# Patient Record
Sex: Female | Born: 1985 | Race: White | Hispanic: No | Marital: Single | Smoking: Never smoker
Health system: Southern US, Community
[De-identification: ages and names within clinical notes are randomized; demographics above are authoritative.]

## PROBLEM LIST (undated history)

## (undated) DIAGNOSIS — G5750 Tarsal tunnel syndrome, unspecified lower limb: Secondary | ICD-10-CM

## (undated) HISTORY — PX: TUBAL LIGATION: SHX77

---
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
## 2005-02-23 ENCOUNTER — Other Ambulatory Visit: Admission: RE | Admit: 2005-02-23 | Discharge: 2005-02-23 | Payer: Self-pay | Admitting: Gynecology

## 2005-03-09 ENCOUNTER — Encounter: Admission: RE | Admit: 2005-03-09 | Discharge: 2005-06-07 | Payer: Self-pay | Admitting: Gynecology

## 2005-09-10 ENCOUNTER — Inpatient Hospital Stay (HOSPITAL_COMMUNITY): Admission: AD | Admit: 2005-09-10 | Discharge: 2005-09-10 | Payer: Self-pay | Admitting: Gynecology

## 2005-11-04 ENCOUNTER — Other Ambulatory Visit: Admission: RE | Admit: 2005-11-04 | Discharge: 2005-11-04 | Payer: Self-pay | Admitting: Gynecology

## 2006-05-01 ENCOUNTER — Ambulatory Visit (HOSPITAL_COMMUNITY): Admission: RE | Admit: 2006-05-01 | Discharge: 2006-05-01 | Payer: Self-pay | Admitting: Gynecology

## 2007-05-20 ENCOUNTER — Inpatient Hospital Stay (HOSPITAL_COMMUNITY): Admission: AD | Admit: 2007-05-20 | Discharge: 2007-05-20 | Payer: Self-pay | Admitting: Obstetrics and Gynecology

## 2007-11-22 ENCOUNTER — Ambulatory Visit (HOSPITAL_COMMUNITY): Admission: RE | Admit: 2007-11-22 | Discharge: 2007-11-22 | Payer: Self-pay | Admitting: Obstetrics and Gynecology

## 2007-11-22 ENCOUNTER — Encounter (INDEPENDENT_AMBULATORY_CARE_PROVIDER_SITE_OTHER): Payer: Self-pay | Admitting: Obstetrics and Gynecology

## 2008-08-01 ENCOUNTER — Encounter (INDEPENDENT_AMBULATORY_CARE_PROVIDER_SITE_OTHER): Payer: Self-pay | Admitting: Obstetrics and Gynecology

## 2008-08-01 ENCOUNTER — Ambulatory Visit: Payer: Self-pay | Admitting: Surgery

## 2008-08-01 ENCOUNTER — Ambulatory Visit: Admission: RE | Admit: 2008-08-01 | Discharge: 2008-08-01 | Payer: Self-pay | Admitting: Obstetrics and Gynecology

## 2008-09-06 ENCOUNTER — Inpatient Hospital Stay (HOSPITAL_COMMUNITY): Admission: AD | Admit: 2008-09-06 | Discharge: 2008-09-06 | Payer: Self-pay | Admitting: Obstetrics and Gynecology

## 2008-10-26 ENCOUNTER — Inpatient Hospital Stay (HOSPITAL_COMMUNITY): Admission: AD | Admit: 2008-10-26 | Discharge: 2008-10-26 | Payer: Self-pay | Admitting: Obstetrics and Gynecology

## 2008-12-08 ENCOUNTER — Inpatient Hospital Stay (HOSPITAL_COMMUNITY): Admission: AD | Admit: 2008-12-08 | Discharge: 2008-12-10 | Payer: Self-pay | Admitting: Obstetrics and Gynecology

## 2009-08-21 ENCOUNTER — Inpatient Hospital Stay (HOSPITAL_COMMUNITY): Admission: AD | Admit: 2009-08-21 | Discharge: 2009-08-21 | Payer: Self-pay | Admitting: Obstetrics & Gynecology

## 2009-09-03 ENCOUNTER — Inpatient Hospital Stay (HOSPITAL_COMMUNITY): Admission: AD | Admit: 2009-09-03 | Discharge: 2009-09-03 | Payer: Self-pay | Admitting: Obstetrics & Gynecology

## 2009-09-05 ENCOUNTER — Ambulatory Visit (HOSPITAL_COMMUNITY): Admission: AD | Admit: 2009-09-05 | Discharge: 2009-09-05 | Payer: Self-pay | Admitting: Obstetrics & Gynecology

## 2009-09-07 ENCOUNTER — Inpatient Hospital Stay (HOSPITAL_COMMUNITY): Admission: AD | Admit: 2009-09-07 | Discharge: 2009-09-07 | Payer: Self-pay | Admitting: Obstetrics and Gynecology

## 2009-09-11 ENCOUNTER — Ambulatory Visit (HOSPITAL_COMMUNITY): Admission: RE | Admit: 2009-09-11 | Discharge: 2009-09-11 | Payer: Self-pay | Admitting: Obstetrics & Gynecology

## 2009-09-16 ENCOUNTER — Inpatient Hospital Stay (HOSPITAL_COMMUNITY): Admission: AD | Admit: 2009-09-16 | Discharge: 2009-09-16 | Payer: Self-pay | Admitting: Obstetrics and Gynecology

## 2009-09-16 ENCOUNTER — Ambulatory Visit (HOSPITAL_COMMUNITY): Admission: RE | Admit: 2009-09-16 | Discharge: 2009-09-16 | Payer: Self-pay | Admitting: Obstetrics & Gynecology

## 2009-09-29 ENCOUNTER — Ambulatory Visit (HOSPITAL_COMMUNITY): Admission: RE | Admit: 2009-09-29 | Discharge: 2009-09-29 | Payer: Self-pay | Admitting: Family Medicine

## 2009-10-10 ENCOUNTER — Inpatient Hospital Stay (HOSPITAL_COMMUNITY): Admission: AD | Admit: 2009-10-10 | Discharge: 2009-10-10 | Payer: Self-pay | Admitting: Family Medicine

## 2009-10-29 ENCOUNTER — Ambulatory Visit: Payer: Self-pay | Admitting: Obstetrics and Gynecology

## 2009-10-29 ENCOUNTER — Encounter: Payer: Self-pay | Admitting: Obstetrics & Gynecology

## 2009-10-29 LAB — CONVERTED CEMR LAB
Bilirubin, Direct: 0.1 mg/dL (ref 0.0–0.3)
INR: 1.05 (ref <1.50)
Indirect Bilirubin: 0.3 mg/dL (ref 0.0–0.9)
Prothrombin Time: 13.6 s (ref 11.6–15.2)
Total Bilirubin: 0.4 mg/dL (ref 0.3–1.2)
aPTT: 31 s

## 2010-09-22 ENCOUNTER — Inpatient Hospital Stay (HOSPITAL_COMMUNITY)
Admission: AD | Admit: 2010-09-22 | Discharge: 2010-09-22 | Payer: Self-pay | Source: Home / Self Care | Admitting: Obstetrics and Gynecology

## 2010-10-04 ENCOUNTER — Inpatient Hospital Stay (HOSPITAL_COMMUNITY): Admission: AD | Admit: 2010-10-04 | Discharge: 2010-10-06 | Payer: Self-pay | Admitting: Obstetrics and Gynecology

## 2010-10-06 ENCOUNTER — Encounter (INDEPENDENT_AMBULATORY_CARE_PROVIDER_SITE_OTHER): Payer: Self-pay | Admitting: Obstetrics and Gynecology

## 2011-01-09 ENCOUNTER — Encounter: Payer: Self-pay | Admitting: Gynecology

## 2011-01-10 ENCOUNTER — Encounter: Payer: Self-pay | Admitting: Obstetrics & Gynecology

## 2011-01-20 ENCOUNTER — Ambulatory Visit
Admission: RE | Admit: 2011-01-20 | Discharge: 2011-01-20 | Disposition: A | Discharge status: Home / Self Care | Payer: BC Managed Care – PPO | Source: Ambulatory Visit | Attending: Family Medicine | Admitting: Family Medicine

## 2011-01-20 ENCOUNTER — Other Ambulatory Visit: Payer: Self-pay | Admitting: Family Medicine

## 2011-01-20 DIAGNOSIS — R102 Pelvic and perineal pain: Secondary | ICD-10-CM

## 2011-01-21 ENCOUNTER — Ambulatory Visit
Admission: RE | Admit: 2011-01-21 | Discharge: 2011-01-21 | Disposition: A | Discharge status: Home / Self Care | Payer: BC Managed Care – PPO | Source: Ambulatory Visit | Attending: Family Medicine | Admitting: Family Medicine

## 2011-01-21 ENCOUNTER — Other Ambulatory Visit: Payer: Self-pay | Admitting: Family Medicine

## 2011-01-21 DIAGNOSIS — R102 Pelvic and perineal pain unspecified side: Secondary | ICD-10-CM

## 2011-03-02 LAB — CBC
HCT: 36.2 % (ref 36.0–46.0)
Hemoglobin: 12.4 g/dL (ref 12.0–15.0)
MCH: 30.8 pg (ref 26.0–34.0)
MCV: 89.9 fL (ref 78.0–100.0)
Platelets: 176 10*3/uL (ref 150–400)
Platelets: 198 10*3/uL (ref 150–400)
RBC: 4.02 MIL/uL (ref 3.87–5.11)
RDW: 14.2 % (ref 11.5–15.5)
WBC: 13.8 10*3/uL — ABNORMAL HIGH (ref 4.0–10.5)

## 2011-03-02 LAB — ABO/RH: ABO/RH(D): O POS

## 2011-03-24 LAB — CBC
HCT: 39.2 % (ref 36.0–46.0)
Hemoglobin: 13.5 g/dL (ref 12.0–15.0)
MCHC: 34.4 g/dL (ref 30.0–36.0)
MCV: 88.1 fL (ref 78.0–100.0)
Platelets: 227 10*3/uL (ref 150–400)
RDW: 12 % (ref 11.5–15.5)
WBC: 10.3 10*3/uL (ref 4.0–10.5)

## 2011-03-24 LAB — SAMPLE TO BLOOD BANK

## 2011-03-25 LAB — PROGESTERONE: Progesterone: 24 ng/mL

## 2011-03-25 LAB — HCG, QUANTITATIVE, PREGNANCY
hCG, Beta Chain, Quant, S: 25174 m[IU]/mL — ABNORMAL HIGH (ref <5)
hCG, Beta Chain, Quant, S: 3950 m[IU]/mL — ABNORMAL HIGH (ref <5)
hCG, Beta Chain, Quant, S: 5445 m[IU]/mL — ABNORMAL HIGH (ref <5)

## 2011-03-31 IMAGING — US US OB TRANSVAGINAL
1 series · 14 of 26 positions shown · non-contrast
Comparison: none

OBSTETRICAL ULTRASOUND:
 This ultrasound exam was performed in the [HOSPITAL] Ultrasound Department.  The OB US report was generated in the AS system, and faxed to the ordering physician.  This report is also available in [REDACTED] PACS.

[Series 1: us ob transvaginal · 14 of 26 slices shown]
[im 1/26]
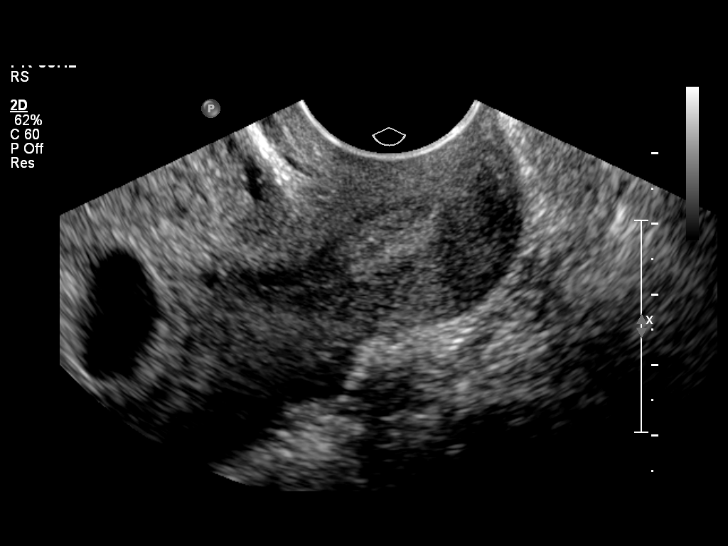
[im 3/26]
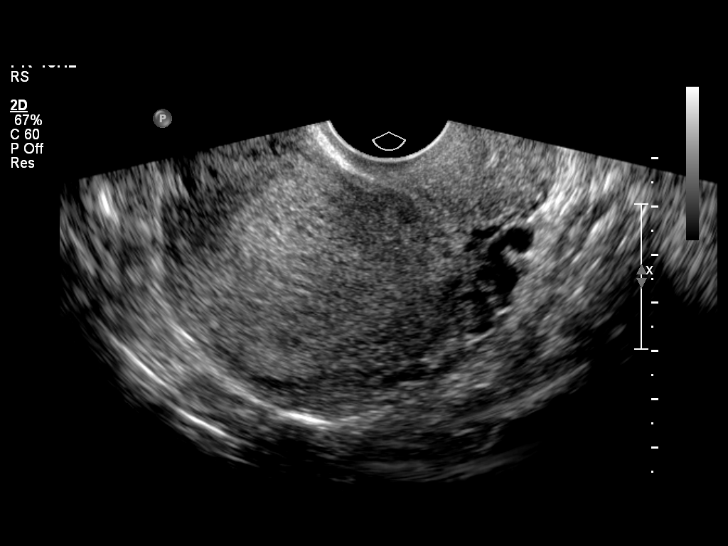
[im 5/26]
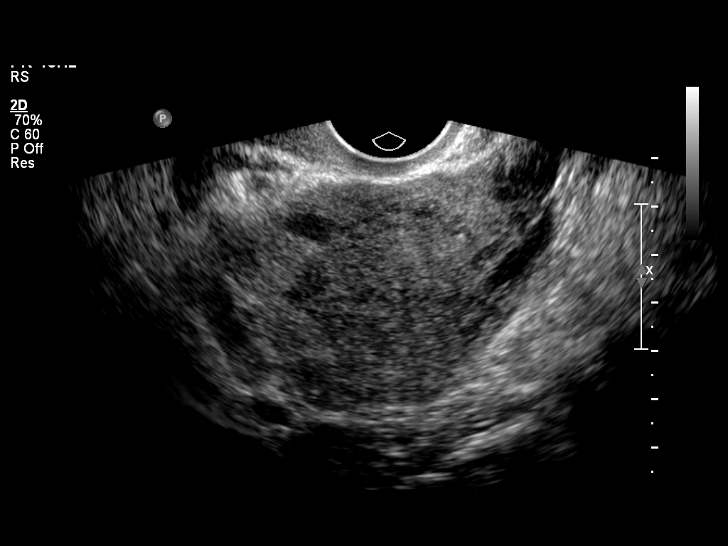
[im 7/26]
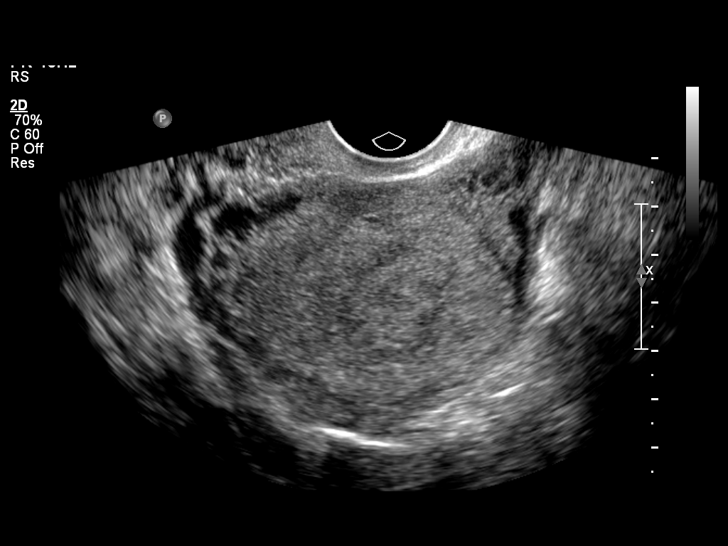
[im 9/26]
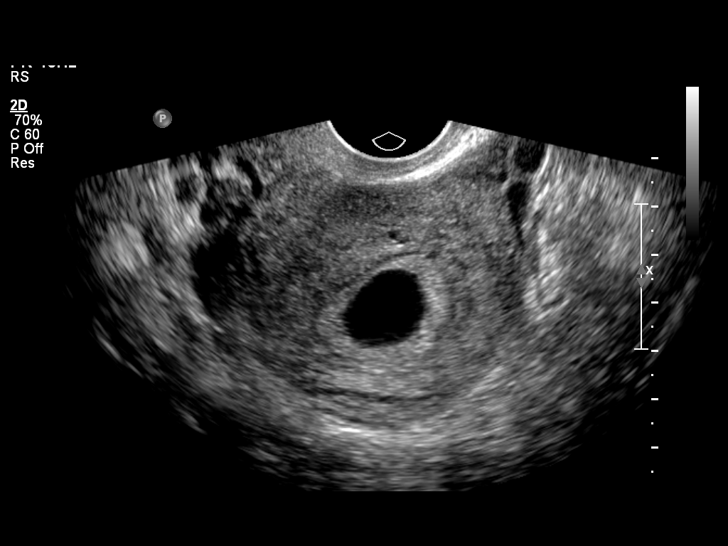
[im 11/26]
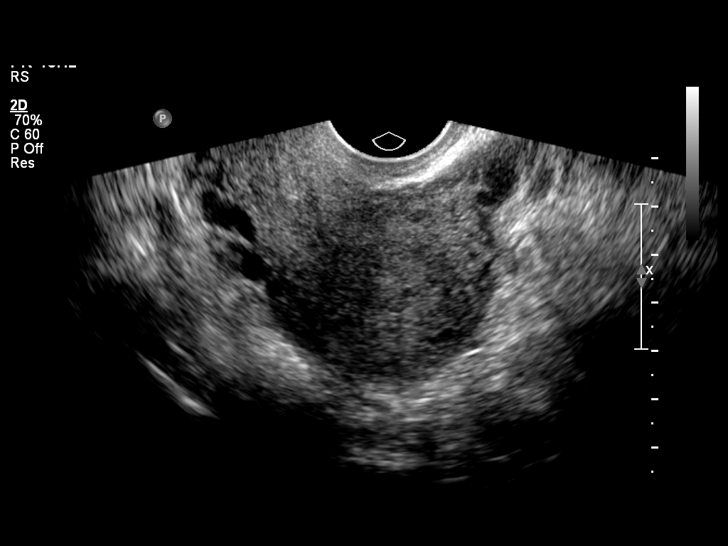
[im 13/26]
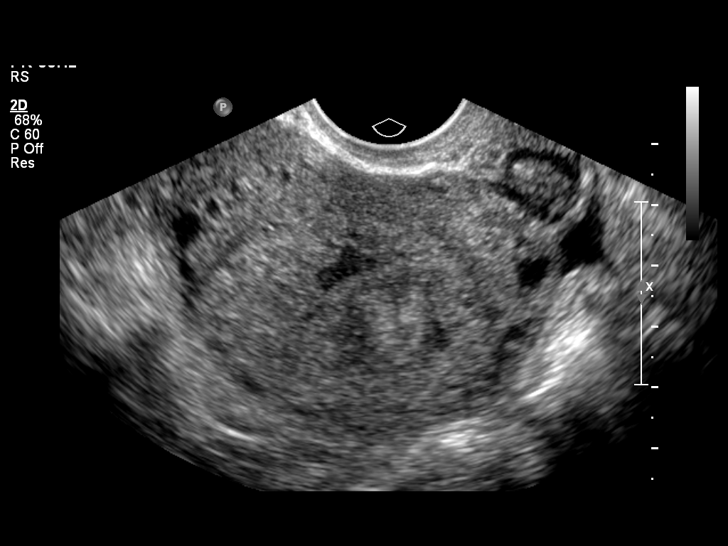
[im 14/26]
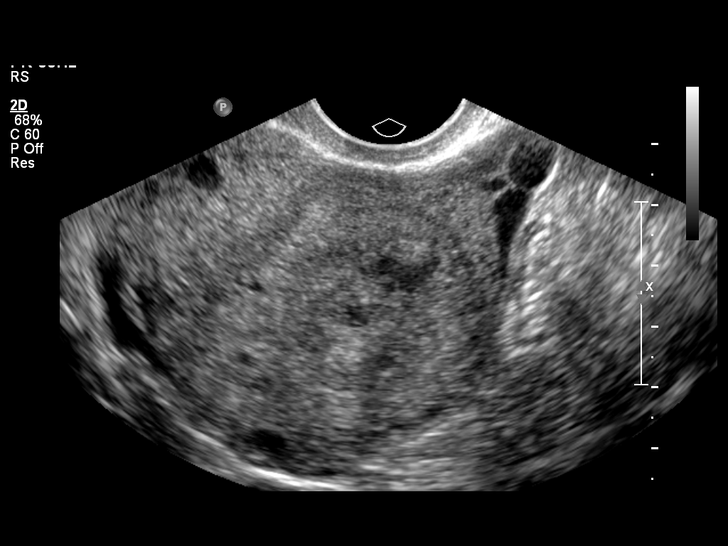
[im 16/26]
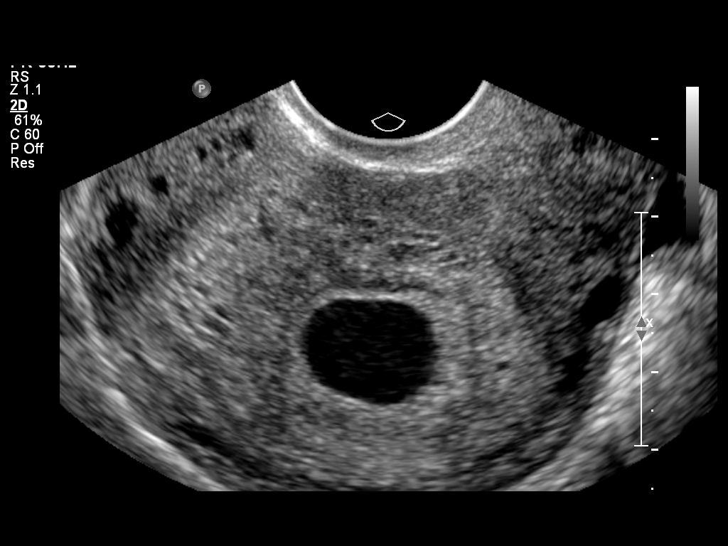
[im 18/26]
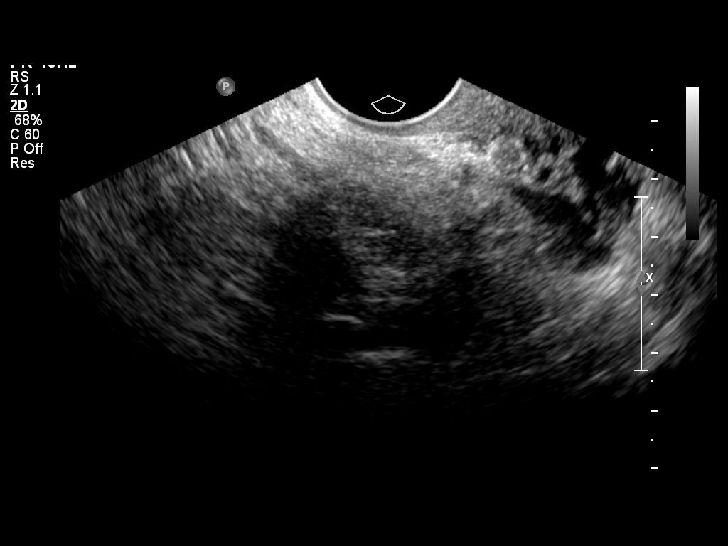
[im 20/26]
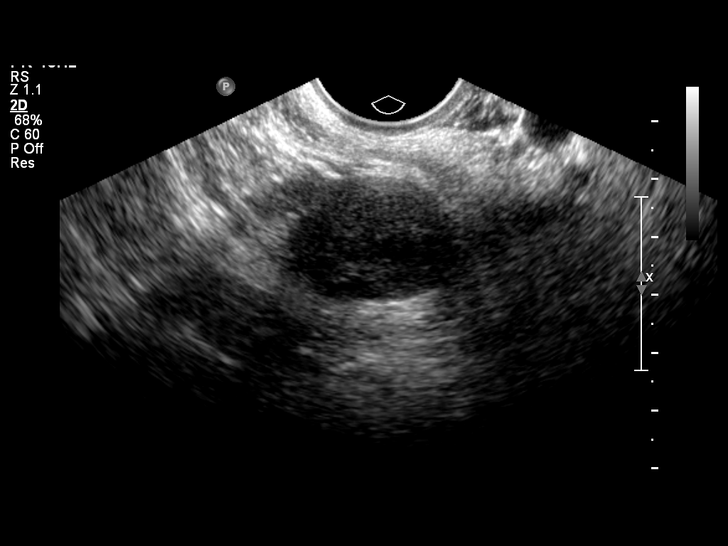
[im 22/26]
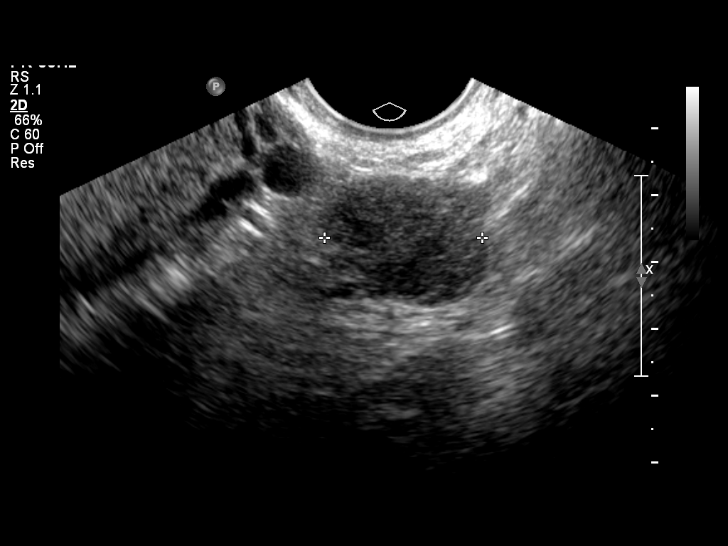
[im 24/26]
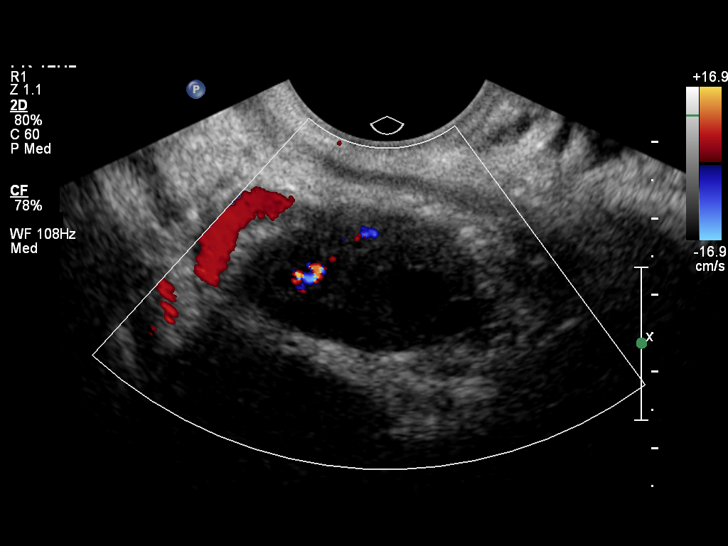
[im 26/26]
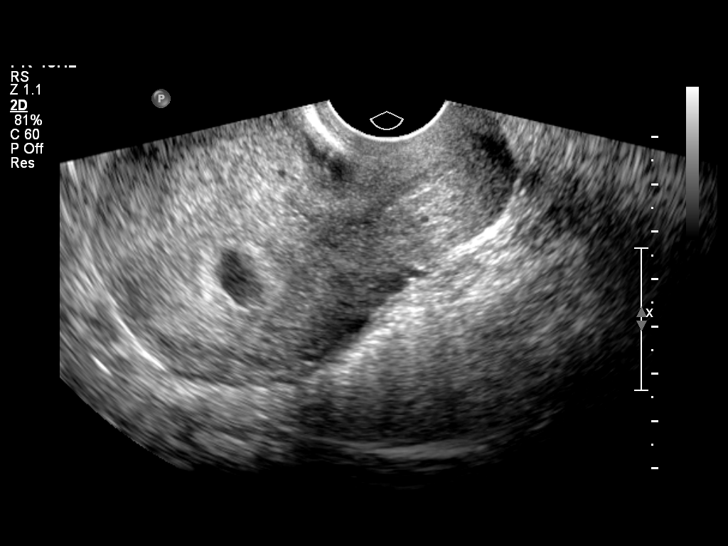

[14 of 26 positions shown; findings below may reference images not displayed]

IMPRESSION: See AS Obstetric US report.

## 2011-05-03 NOTE — Op Note (Signed)
Sheila Cross, Sheila Cross             ACCOUNT NO.:  [id number]   MEDICAL RECORD NO.:  [id number]          PATIENT TYPE:  AMB   LOCATION:  SDC                           FACILITY:  [facility]   PHYSICIAN:  Guy Sandifer. Henderson Cloud, M.D. DATE OF BIRTH:  Sep 14, 1986   DATE OF PROCEDURE:  11/22/2007  DATE OF DISCHARGE:                               OPERATIVE REPORT   PREOPERATIVE DIAGNOSIS:  Spontaneous abortion.   POSTOPERATIVE DIAGNOSIS:  Spontaneous abortion.   PROCEDURE:  Dilatation evacuation and 1% Xylocaine paracervical block.   SURGEON:  Harold Hedge, MD   ANESTHESIA:  General with LMA.   SPECIMENS:  Products of conception to pathology and a portion for  karyotype.   ESTIMATED BLOOD LOSS:  Minimal.   INDICATIONS AND CONSENT:  The patient is a 25 year old married white  female G5, P2 with spontaneous miscarriage.  Details are dictated in the  history and physical.  Dilatation evacuation has been discussed with the  patient her husband.  Potential risks and complications were discussed  preoperatively including but not limited to infection, uterine  perforation, organ damage, bleeding requiring transfusion of blood  products with possible HIV and hepatitis acquisition, DVT, PE,  pneumonia, intrauterine synechia, secondary infertility, hysterectomy.  All questions have been answered.  We also discussed the fact that we  will send some tissue for karyotype and obtain viral stains on the  tissues if possible.   PROCEDURE:  The patient is taken to operating room where she is  identified, placed in dorsosupine position and general anesthesia is  induced via LMA.  She is then placed in dorsal lithotomy position where  she is prepped, bladder straight catheterized and draped in sterile  fashion.  Examination reveals uterus to be 8 to 10 weeks in size.  Bivalve speculum was placed in vagina.  The anterior cervical lip was  injected with 1% Xylocaine and grasped with single-tooth tenaculum.  Paracervical block was placed at 2, 4, 5, 7, 8 and 10 o'clock positions  with approximately 20 mL total of 1% plain Xylocaine.  Cervix gently  progressively dilated.  #9 curved curette placed in the uterine cavity  and suction curettage is carried out for obvious products of conception.  20 units of Pitocin were added to 1 liter of IV fluids after the initial  pass with suction curette.  Alternating sharp and suction curettage is  carried out until  the cavity is clean.  Good hemostasis is noted.  All instruments are  removed and all counts are correct.  Blood type is O+.  Tissue sent to  pathology in a small portion sent for karyotype.  She will be discharged  home on Methergine 0.2 mg p.o. t.i.d. for six doses.  Follow-up is in  the office in 2 weeks.      Guy Sandifer Henderson Cloud, M.D.  Electronically Signed     JET/MEDQ  D:  11/22/2007  T:  11/22/2007  Job:  [id number]

## 2011-05-03 NOTE — H&P (Signed)
Sheila Cross, Sheila Cross             ACCOUNT NO.:  [id number]   MEDICAL RECORD NO.:  [id number]          PATIENT TYPE:  AMB   LOCATION:  SDC                           FACILITY:  [facility]   PHYSICIAN:  Guy Sandifer. Henderson Cloud, M.D. DATE OF BIRTH:  October 22, 1986   DATE OF ADMISSION:  11/22/2007  DATE OF DISCHARGE:                              HISTORY & PHYSICAL   REASON FOR ADMISSION:  Miscarriage.   HISTORY OF PRESENT ILLNESS:  This patient is 25 year old married white  female G5, P2 with an LMP of September 10, 2007.  On November 19, 2007  the patient presents for an examination.  Ultrasound reveals an  intrauterine pregnancy with a crown-rump length equivalent to 9 weeks  and 4 days.  No fetal heart beat is noted on prolonged examination.  Hydropic changes were also noted.  Spontaneous abortion is discussed  with the patient and her husband.  Dilatation and evacuation is reviewed  and potential risks and complications were reviewed.   PAST MEDICAL HISTORY:  Negative.   PAST SURGICAL HISTORY:  Wisdom tooth extraction and appendicitis.   OBSTETRICAL HISTORY:  Vaginal delivery x2, miscarriage x2.   FAMILY HISTORY:  Heart disease, chronic hypertension and diabetes.   MEDICATIONS:  Prenatal vitamins.   ALLERGIES:  To CILLINS.   SOCIAL HISTORY:  Denies tobacco, alcohol or drug abuse.   REVIEW OF SYSTEMS:  NEUROLOGIC:  Denies headache.  CARDIOVASCULAR:  Denies chest pain.  PULMONARY:  Denies shortness of breath.   PHYSICAL EXAMINATION:  VITAL SIGNS:  Height 5 feet 8 inches, weight  162.4 pounds, blood pressure 104/70.  LUNGS:  Clear to auscultation.  HEART:  Regular rate and rhythm.  BREASTS:  Not examined.  ABDOMEN:  Soft, nontender.  PELVIC EXAM:  Vulva, vagina, cervix without lesion.  Cervix closed,  thick and high.  Uterus is 8 to 10 weeks in size.  Adnexa nontender  without masses.  EXTREMITIES:  Grossly within normal limits.  NEUROLOGIC:  Grossly within normal limits.   ASSESSMENT:   Spontaneous abortion.   PLAN:  Dilatation and evacuation with products of conception for  karyotype.      Guy Sandifer Henderson Cloud, M.D.  Electronically Signed     JET/MEDQ  D:  11/22/2007  T:  11/22/2007  Job:  [id number]

## 2011-05-06 NOTE — Discharge Summary (Signed)
Sheila Cross, Sheila Cross                         ACCOUNT NO.:  [id number]   MEDICAL RECORD NO.:  [id number]                   PATIENT TYPE:  INP   LOCATION:  9120                                 FACILITY:  [facility]   PHYSICIAN:  Juan H. Lily Peer, M.D.             DATE OF BIRTH:  August 12, 1986   DATE OF ADMISSION:  03/15/2004  DATE OF DISCHARGE:  03/18/2004                                 DISCHARGE SUMMARY   DISCHARGE DIAGNOSES:  1. Intrauterine pregnancy at 37+ weeks, delivered.  2. Adolescent pregnancy.  3. Maternal anxiety.  4. Question of narrow pelvimetry.  5. Status post forceps-assisted vaginal delivery.   HISTORY:  This is a 17-years-of-age female gravida 1 para 0 with an EDC of  April 02, 2004.  The patient's pregnancy was complicated by anxiety and  apprehension throughout the entire pregnancy for which she was also ready to  be delivered before March 28 due to financial resources being limited and  stating that insurance was going to be terminated and therefore had to pay  co-pay for which she could not afford if she had not delivered on March 28.  As the patient desired an elective induction the patient ultrasound  performed in the office on March 28 showed an estimated fetal weight of 2715  g which was the 25th percentile and the patient desired an elective  induction for same.  The patient was counseled, benefits, pros and cons  explained by M.D.  There was no indication for cesarean section and that due  to her cervix appeared to be unfavorable this could result in a serial  induction.  She was admitted.   HOSPITAL COURSE:  On March 15, 2004 the patient was admitted for elective  induction at 37.5 weeks, was given Cervidil, and Pitocin was begun on the  following a.m. and subsequently on March 16, 2004 the patient underwent a  forceps-assisted vaginal delivery secondary to deep variables of a female,  Apgars of 6 and 8, weight of 5 pounds 9 ounces.  There was a midline  episiotomy.  There was a nuchal cord which was reduced.  The cord was also  noted to be tight in fist.  Postpartum the patient remained afebrile,  voiding, in stable condition and she was discharged to home on March 18, 2004 and given [facility] Gynecology postpartum instructions and postpartum  booklet.   ACCESSORY CLINICAL FINDINGS/LABORATORY DATA:  The patient is O positive,  rubella immune.  On March 17, 2004 hemoglobin was 10.9.   DISPOSITION:  The patient is discharged to home, informed to return to the  office in 6 weeks, if had any problem prior to that time to be seen in the  office.     Susa Loffler, P.A.                    Juan H. Lily Peer, M.D.    TSG/MEDQ  D:  04/05/2004  T:  04/05/2004  Job:  [id number]

## 2011-05-06 NOTE — H&P (Signed)
Sheila Cross, PAIR                         ACCOUNT NO.:  [id number]   MEDICAL RECORD NO.:  [id number]                   PATIENT TYPE:  INP   LOCATION:  9174                                 FACILITY:  [facility]   PHYSICIAN:  Juan H. Lily Peer, M.D.             DATE OF BIRTH:  04-23-1986   DATE OF ADMISSION:  03/15/2004  DATE OF DISCHARGE:                                HISTORY & PHYSICAL   CHIEF COMPLAINT:  1. Adolescent pregnancy.  2. Maternal anxiety.  3. Questionable narrow pelvimetry.   HISTORY OF PRESENT ILLNESS:  The patient is a 25 year old gravida 1, para 0  at 37-1/2 weeks estimated gestational age, with estimated date of  confinement on April 02, 2004.  The patient has had an unremarkable prenatal  course, with the exception of anxiety and apprehension throughout the whole  pregnancy.  She has had bacterial vaginosis and urinary tract infection  treated respectively during her pregnancy.  Her wishes also were to be  delivered before March 15, 2004 due to financial resources being limited and  stating that her insurance was going to be terminated, and they would have  to pay COBRA, for which she could not afford, if she is not delivered by  March 15, 2004.  In essence this is an elective induction as well.  Her last  ultrasound in the office was done on March 15, 2004, secondary to concerns  whether this may be an LGA baby, and she was perhaps thinking that she may  need a cesarean section.  But, based on the ultrasound today, demonstrated  an estimated fetal weight of 2715 g, 25th percentile for 30 some weeks  gestation.  AFI was normal, vertex presentation, posterior fundal placenta.   PAST MEDICAL HISTORY:  Unremarkable.   ALLERGIES:  NO KNOWN DRUG ALLERGIES.   REVIEW OF SYSTEMS:  See [place] form.   PHYSICAL EXAMINATION:  VITAL SIGNS:  Blood pressure 124/82, urine was  negative for protein and glucose.  Weight 176 pounds.  HEENT:  Unremarkable.  NECK:  Supple,  trachea midline.  No carotid bruits, no thyromegaly.  LUNGS:  Clear to auscultation without any rhonchi or wheezes.  HEART:  Regular rate and rhythm; no murmurs, rubs or gallops.  BREASTS:  Examination not done.  ABDOMEN:  Gravid uterus, vertex presentation by Leopold's maneuver.  PELVIC:  Cervix was found to be long, closed and posterior.  EXTREMITIES:  DTR 1+.  Negative clonus.  Trace edema.   PRENATAL LABS:  O positive blood type.  Antibody screen and VDRL was  nonreactive.  Rubella immune.  Hepatitis B surface antigen and HIV were  negative.  Alpha fetoprotein was normal.  Diabetes screen normal.  GBS  culture was negative.  Pap smear was negative.   ASSESSMENT:  A 25 year old gravida 1, para 0 at 37-1/2 weeks estimated  gestational age, with anxiety and apprehension throughout her pregnancy.  She is not coping well  with her pregnancy and also there has been a question  of whether she may have had narrow pelvimetry and possibly a large baby;  but, ultrasound today demonstrated that the fetus is in the 25th percentile  for [redacted] weeks gestation, at 2715 g.  She had a negative GBS culture.  Also  this is an elective induction, due to the fact that her insurance will no  longer pay for her care or delivery if she is not delivered by March 19, 2004.   The risks, benefits, pros and cons of the induction were discussed.  I  explained to her that at this point there is no indication for a cesarean  section.  She will undergo a Cervidil for cervical ripening on the evening  of March 15, 2004, with Pitocin IV on March 16, 2004.  She is aware that due  to the fact her cervix appears to be unfavorable, this could result in a  serial induction.  All other questions were answered, and we will follow  accordingly.   PLAN:  As per assessment above.                                               Juan H. Lily Peer, M.D.    JHF/MEDQ  D:  03/16/2004  T:  03/16/2004  Job:  [id number]

## 2011-05-24 ENCOUNTER — Emergency Department (INDEPENDENT_AMBULATORY_CARE_PROVIDER_SITE_OTHER): Payer: BC Managed Care – PPO

## 2011-05-24 ENCOUNTER — Emergency Department (HOSPITAL_BASED_OUTPATIENT_CLINIC_OR_DEPARTMENT_OTHER)
Admission: EM | Admit: 2011-05-24 | Discharge: 2011-05-24 | Disposition: A | Discharge status: Home / Self Care | Payer: BC Managed Care – PPO | Attending: Emergency Medicine | Admitting: Emergency Medicine

## 2011-05-24 DIAGNOSIS — R1033 Periumbilical pain: Secondary | ICD-10-CM | POA: Insufficient documentation

## 2011-05-24 DIAGNOSIS — R110 Nausea: Secondary | ICD-10-CM

## 2011-05-24 DIAGNOSIS — R112 Nausea with vomiting, unspecified: Secondary | ICD-10-CM | POA: Insufficient documentation

## 2011-05-24 DIAGNOSIS — R197 Diarrhea, unspecified: Secondary | ICD-10-CM

## 2011-05-24 DIAGNOSIS — R109 Unspecified abdominal pain: Secondary | ICD-10-CM

## 2011-05-24 LAB — DIFFERENTIAL
Basophils Relative: 0 % (ref 0–1)
Eosinophils Absolute: 0.2 10*3/uL (ref 0.0–0.7)
Eosinophils Relative: 1 % (ref 0–5)
Monocytes Absolute: 1.3 10*3/uL — ABNORMAL HIGH (ref 0.1–1.0)
Monocytes Relative: 10 % (ref 3–12)

## 2011-05-24 LAB — CBC
Hemoglobin: 14.2 g/dL (ref 12.0–15.0)
MCH: 29.3 pg (ref 26.0–34.0)
MCHC: 35.8 g/dL (ref 30.0–36.0)
Platelets: 231 10*3/uL (ref 150–400)
RDW: 12.4 % (ref 11.5–15.5)

## 2011-05-24 LAB — URINALYSIS, ROUTINE W REFLEX MICROSCOPIC
Bilirubin Urine: NEGATIVE
Hgb urine dipstick: NEGATIVE
Ketones, ur: NEGATIVE mg/dL
Protein, ur: NEGATIVE mg/dL
Urobilinogen, UA: 0.2 mg/dL (ref 0.0–1.0)

## 2011-05-24 LAB — COMPREHENSIVE METABOLIC PANEL
AST: 27 U/L (ref 0–37)
CO2: 25 mEq/L (ref 19–32)
Calcium: 9.7 mg/dL (ref 8.4–10.5)
Creatinine, Ser: 0.5 mg/dL (ref 0.4–1.2)
GFR calc Af Amer: >60 mL/min (ref >60)
GFR calc non Af Amer: >60 mL/min (ref >60)

## 2011-05-24 MED ORDER — IOHEXOL 300 MG/ML  SOLN: 100.0000 mL | Freq: Once | INTRAMUSCULAR | Status: DC | PRN

## 2011-09-20 LAB — URINALYSIS, ROUTINE W REFLEX MICROSCOPIC
Glucose, UA: NEGATIVE
Leukocytes, UA: NEGATIVE
Protein, ur: NEGATIVE
Urobilinogen, UA: 0.2

## 2011-09-20 LAB — CBC
HCT: 35.6 — ABNORMAL LOW
MCHC: 34.5
MCV: 89.8
Platelets: 191
RDW: 12.8

## 2011-09-20 LAB — URINE MICROSCOPIC-ADD ON

## 2011-09-23 LAB — CBC
Platelets: 189 10*3/uL (ref 150–400)
RBC: 3.44 MIL/uL — ABNORMAL LOW (ref 3.87–5.11)
RDW: 13.2 % (ref 11.5–15.5)
WBC: 9.6 10*3/uL (ref 4.0–10.5)

## 2011-09-26 LAB — CBC
HCT: 39.3
MCV: 89.2
RBC: 4.4
WBC: 8.6

## 2011-10-06 LAB — POCT PREGNANCY, URINE: Operator id: 145751

## 2011-10-06 LAB — CBC
Hemoglobin: 13.6
MCHC: 34
MCV: 88.3
RBC: 4.54
WBC: 9

## 2011-11-15 ENCOUNTER — Encounter: Payer: BC Managed Care – PPO | Attending: Certified Nurse Midwife | Admitting: *Deleted

## 2011-11-15 DIAGNOSIS — E282 Polycystic ovarian syndrome: Secondary | ICD-10-CM | POA: Insufficient documentation

## 2011-11-15 DIAGNOSIS — Z713 Dietary counseling and surveillance: Secondary | ICD-10-CM | POA: Insufficient documentation

## 2011-11-15 DIAGNOSIS — R7309 Other abnormal glucose: Secondary | ICD-10-CM | POA: Insufficient documentation

## 2011-11-15 DIAGNOSIS — E669 Obesity, unspecified: Secondary | ICD-10-CM | POA: Insufficient documentation

## 2011-11-16 ENCOUNTER — Encounter: Payer: Self-pay | Admitting: *Deleted

## 2011-11-16 NOTE — Progress Notes (Signed)
  Medical Nutrition Therapy:  Appt start time: 1200 end time:  1300.   Assessment:  Primary concerns today: Patient is here with her two sons, both under age 25, so our conversations were limited due to their activities and needs. Patient states recently diagnosed with PCOS and has recent weight gain of 35 pounds since delivery of her last baby just over a year ago. She has had elevated fasting BG and tests her BG twice a day, fasting and 2 hours after her largest meal, which is typically supper. She did not bring her log book with her today. She states she cannot feel full, even after fairly large meals. She has history of 9 pregnancies, 5 miscarriages and 4 term deliveries.  MEDICATIONS: Metformin   DIETARY INTAKE:  Usual eating pattern includes 3 meals and 2-3 snacks per day.  Everyday foods include - good variety of all food groups.    24-hr recall:  B ( AM): large bagel with yogurt or cream cheese, or large bowl of sweetened or unsweetened cereal with 2 oz milk  Snk ( AM): 3 graham crackers  L ( PM): sandwich on whole wheat bread, sweet tea, occasionally chips Snk ( PM): occasionally fruit D ( PM): meat, starch, vegetable and occasional salad with sweet tea, water or milk Snk ( PM): occasional popcorn Beverages: milk, sweet tea, water  Usual physical activity: none planned. Active with 4 children at home  Estimated energy needs: 1400 calories 158 g carbohydrates 105 g protein 39 g fat  Progress Towards Goal(s):  In progress.   Nutritional Diagnosis:  NI-1.5 Excessive energy intake As related to recent weight gain.  As evidenced by BMI of 30.2.    Intervention:  Nutrition counseling provided as able with small children here. Discussed caloric value of carbohydrate, protein and fats as well as appropriate portion sizes. Suggested she aim for 3 carb choices / meal +/- 1 either way and 1 carb choice / snack if hungry. Suggested she include a protein or small amount of fat to  provide satiety and decrease sense of hunger. We were not able to complete the visit with children here, so we plan to talk on the phone this afternoon from her home. She may benefit from using Byetta to assist with BG and hunger management also.  Handouts given during visit include:  Living Well with Type 2 Diabetes  Carb Counting and Beyond handout  Carb Counting and Meal Planning by NovoNordisk  Monitoring/Evaluation:  Dietary intake, continued self monitoring of BG daily and bring to next appointment, and body weight in 3 week(s). Shawni plans to: Aim for 3 carb choices +/- 1 per meal, 1/snack if hungry, along with moderate protein and fat intake Consider options for increasing activity level by next visit Continue checking BG daily and bring log book to next appointment

## 2011-11-16 NOTE — Patient Instructions (Addendum)
Katelen plans to:  Aim for 3 carb choices +/- 1 per meal, 1/snack if hungry, along with moderate protein and fat intake Consider options for increasing activity level by next visit Continue checking BG daily and bring log book to next appointment

## 2012-09-04 ENCOUNTER — Emergency Department (HOSPITAL_BASED_OUTPATIENT_CLINIC_OR_DEPARTMENT_OTHER): Payer: Self-pay

## 2012-09-04 ENCOUNTER — Emergency Department (HOSPITAL_BASED_OUTPATIENT_CLINIC_OR_DEPARTMENT_OTHER)
Admission: EM | Admit: 2012-09-04 | Discharge: 2012-09-04 | Disposition: A | Discharge status: Home / Self Care | Payer: Self-pay | Attending: Emergency Medicine | Admitting: Emergency Medicine

## 2012-09-04 ENCOUNTER — Encounter (HOSPITAL_BASED_OUTPATIENT_CLINIC_OR_DEPARTMENT_OTHER): Payer: Self-pay | Admitting: *Deleted

## 2012-09-04 DIAGNOSIS — S96819A Strain of other specified muscles and tendons at ankle and foot level, unspecified foot, initial encounter: Secondary | ICD-10-CM | POA: Insufficient documentation

## 2012-09-04 DIAGNOSIS — W19XXXA Unspecified fall, initial encounter: Secondary | ICD-10-CM | POA: Insufficient documentation

## 2012-09-04 DIAGNOSIS — G5750 Tarsal tunnel syndrome, unspecified lower limb: Secondary | ICD-10-CM | POA: Insufficient documentation

## 2012-09-04 DIAGNOSIS — IMO0002 Reserved for concepts with insufficient information to code with codable children: Secondary | ICD-10-CM

## 2012-09-04 DIAGNOSIS — S93499A Sprain of other ligament of unspecified ankle, initial encounter: Secondary | ICD-10-CM | POA: Insufficient documentation

## 2012-09-04 HISTORY — DX: Tarsal tunnel syndrome, unspecified lower limb: G57.50

## 2012-09-04 MED ORDER — HYDROCODONE-ACETAMINOPHEN 5-325 MG PO TABS
1.0000 | ORAL_TABLET | Freq: Once | ORAL | Status: AC
  Administered 2012-09-04: 1 via ORAL
  Filled 2012-09-04: qty 1

## 2012-09-04 MED ORDER — NAPROXEN 500 MG PO TABS: 500.0000 mg | ORAL_TABLET | Freq: Two times a day (BID) | ORAL | Status: DC

## 2012-09-04 NOTE — ED Notes (Signed)
Pt reports she is unable to put any pressure on the L foot or ankle.  She has history of tarsal tunnel in the L foot as well.

## 2012-09-04 NOTE — ED Provider Notes (Signed)
History     CSN: [id number]  Arrival date & time 09/04/12  1551   First MD Initiated Contact with Patient 09/04/12 1630      Chief Complaint  Patient presents with  . Foot Injury    Pt. reports falling on her L ankle and hearing a loud pop.  Pt. reports she has pain in the bottom of the foot also.  Noted mild edema in the L outer ankle area.    HPI Patient states this morning she fell twisting her left ankle and heard a loud pop. She is now having pain in the bottom of her foot, ankle and up towards her knee. Patient states she cannot put any weight on her foot. She denies any numbness or weakness. She does have chronic trouble of tarsal tunnel syndrome in her left foot. Past Medical History  Diagnosis Date  . Tarsal tunnel syndrome     Past Surgical History  Procedure Date  . Tubal ligation   . Cesarean section     No family history on file.  History  Substance Use Topics  . Smoking status: Never Smoker   . Smokeless tobacco: Never Used  . Alcohol Use: Not on file    OB History    Grav Para Term Preterm Abortions TAB SAB Ect Mult Living                  Review of Systems  Constitutional: Negative for fever.  Musculoskeletal: Positive for joint swelling. Negative for back pain.  Skin: Negative for rash and wound.    Allergies  Review of patient's allergies indicates not on file.  Home Medications   Current Outpatient Rx  Name Route Sig Dispense Refill  . METFORMIN HCL 500 MG PO TABS Oral Take 500 mg by mouth 2 (two) times daily with a meal.        BP 124/63  Pulse 62  Temp 98.6 F (37 C) (Oral)  Resp 16  Ht 5\' 8"  (1.727 m)  Wt 174 lb (78.926 kg)  BMI 26.46 kg/m2  SpO2 100%  LMP 08/04/2012  Physical Exam  Nursing note and vitals reviewed. Constitutional: She appears well-developed and well-nourished. No distress.  HENT:  Head: Normocephalic and atraumatic.  Right Ear: External ear normal.  Left Ear: External ear normal.  Eyes: Conjunctivae  normal are normal. Right eye exhibits no discharge. Left eye exhibits no discharge. No scleral icterus.  Neck: Neck supple. No tracheal deviation present.  Cardiovascular: Normal rate.   Pulmonary/Chest: Effort normal. No stridor. No respiratory distress.  Musculoskeletal: She exhibits tenderness. She exhibits no edema.       Left ankle: She exhibits no swelling and no deformity. tenderness. Lateral malleolus and medial malleolus tenderness found.       Left lower leg: She exhibits tenderness and bony tenderness. She exhibits no swelling, no edema and no deformity.       Left foot: She exhibits tenderness. She exhibits no swelling and no deformity.  Neurological: She is alert. Cranial nerve deficit: no gross deficits.  Skin: Skin is warm and dry. No rash noted.  Psychiatric: She has a normal mood and affect.    ED Course  Procedures (including critical care time)  Labs Reviewed - No data to display Dg Tibia/fibula Left  09/04/2012  *RADIOLOGY REPORT*  Clinical Data: Foot injury  LEFT TIBIA AND FIBULA - 2 VIEW  Comparison: None.  Findings: No acute fracture and no dislocation.  Unremarkable soft tissues.  IMPRESSION: No  acute bony pathology.   Original Report Authenticated By: Donavan Burnet, M.D.    Dg Ankle Complete Left  09/04/2012  *RADIOLOGY REPORT*  Clinical Data: Injury  LEFT ANKLE COMPLETE - 3+ VIEW  Comparison: None.  Findings: No acute fracture and no dislocation. Unremarkable soft tissues.  Ankle mortise is anatomic.  IMPRESSION: No acute bony pathology.   Original Report Authenticated By: Donavan Burnet, M.D.    Dg Foot Complete Left  09/04/2012  *RADIOLOGY REPORT*  Clinical Data: 26 year old female status post blunt trauma with pain.  LEFT FOOT - COMPLETE 3+ VIEW  Comparison: None.  Findings: Bone mineralization is within normal limits.  Calcaneus appears intact.  Joint spaces within normal limits.  No acute fracture or dislocation identified in the foot.  IMPRESSION: No acute  fracture or dislocation identified about the left foot.   Original Report Authenticated By: Ulla Potash III, M.D.      1. Sprain of ankle and foot       MDM  C/w sprain.  Will dc home with crutches and aso splint.  Follow up with sports med as needed        Celene Kras, MD 09/04/12 1724

## 2012-10-12 ENCOUNTER — Encounter (HOSPITAL_BASED_OUTPATIENT_CLINIC_OR_DEPARTMENT_OTHER): Payer: Self-pay | Admitting: *Deleted

## 2012-10-12 ENCOUNTER — Emergency Department (HOSPITAL_BASED_OUTPATIENT_CLINIC_OR_DEPARTMENT_OTHER)
Admission: EM | Admit: 2012-10-12 | Discharge: 2012-10-12 | Disposition: A | Discharge status: Home / Self Care | Payer: Self-pay | Attending: Emergency Medicine | Admitting: Emergency Medicine

## 2012-10-12 DIAGNOSIS — E119 Type 2 diabetes mellitus without complications: Secondary | ICD-10-CM | POA: Insufficient documentation

## 2012-10-12 DIAGNOSIS — G5600 Carpal tunnel syndrome, unspecified upper limb: Secondary | ICD-10-CM | POA: Insufficient documentation

## 2012-10-12 DIAGNOSIS — R55 Syncope and collapse: Secondary | ICD-10-CM | POA: Insufficient documentation

## 2012-10-12 DIAGNOSIS — Z79899 Other long term (current) drug therapy: Secondary | ICD-10-CM | POA: Insufficient documentation

## 2012-10-12 LAB — COMPREHENSIVE METABOLIC PANEL
BUN: 14 mg/dL (ref 6–23)
CO2: 26 mEq/L (ref 19–32)
Calcium: 9.4 mg/dL (ref 8.4–10.5)
Creatinine, Ser: 0.9 mg/dL (ref 0.50–1.10)
GFR calc Af Amer: >90 mL/min (ref >90)
GFR calc non Af Amer: 87 mL/min — ABNORMAL LOW (ref >90)
Glucose, Bld: 114 mg/dL — ABNORMAL HIGH (ref 70–99)

## 2012-10-12 LAB — CBC WITH DIFFERENTIAL/PLATELET
Eosinophils Relative: 1 % (ref 0–5)
HCT: 38.6 % (ref 36.0–46.0)
Lymphocytes Relative: 31 % (ref 12–46)
Lymphs Abs: 2.6 10*3/uL (ref 0.7–4.0)
MCV: 84.3 fL (ref 78.0–100.0)
Monocytes Absolute: 1 10*3/uL (ref 0.1–1.0)
Monocytes Relative: 12 % (ref 3–12)
RBC: 4.58 MIL/uL (ref 3.87–5.11)
RDW: 12.1 % (ref 11.5–15.5)
WBC: 8.2 10*3/uL (ref 4.0–10.5)

## 2012-10-12 LAB — URINALYSIS, ROUTINE W REFLEX MICROSCOPIC
Glucose, UA: NEGATIVE mg/dL
Ketones, ur: NEGATIVE mg/dL
Leukocytes, UA: NEGATIVE
Protein, ur: NEGATIVE mg/dL

## 2012-10-12 LAB — PHOSPHORUS: Phosphorus: 4.4 mg/dL (ref 2.3–4.6)

## 2012-10-12 NOTE — ED Notes (Signed)
Pt reports weakness, dizziness, and chills intermittently for past few months. States that she thought it was due to intentional weight loss, but that she has been dieting healthily. States that chills come on when she exercises. Concerned that she may have an electrolyte imbalance or thyroid problem.

## 2012-10-12 NOTE — ED Notes (Signed)
CBG= 121

## 2012-10-12 NOTE — ED Notes (Signed)
For the past couple of months she has been getting shaky for no known reason. Weight loss. Hx of taking Metformin for high sugar that she took herself off of about the same time the shaky feeling started.

## 2012-10-12 NOTE — ED Provider Notes (Signed)
History     CSN: [id number]  Arrival date & time 10/12/12  2002   First MD Initiated Contact with Patient 10/12/12 2018      Chief Complaint  Patient presents with  . Weakness    (Consider location/radiation/quality/duration/timing/severity/associated sxs/prior treatment) HPI Comments: Patient states that over the last 2-3 months she has episodes of feeling shaky, difficulty focusing and not feeling herself. Sometimes these episodes can last for days and then resolve. She has times of feeling normal as well. She has had a weight loss of greater than 40 pounds over the last 5-6 months. She is trying to lose weight by diet and exercise. She denies any diet pills. These episodes occur she denies having pain, headaches, dizziness, chest pain, palpitations, shortness of breath. Symptoms do not improve with the ED at rest. They do not come specifically with activity. She saw her Dr. several months ago who was concerned about possible electrolyte abnormalities however at that time she did not have a workup due to her insurance status. Patient does have 4 children one of whom is special needs but she denies any symptoms of postpartum depression and denies history of anxiety or panic attacks  Patient is a 26 y.o. female presenting with weakness. The history is provided by the patient.  Weakness Primary symptoms do not include fever, nausea or vomiting.  Additional symptoms include weakness.    Past Medical History  Diagnosis Date  . Tarsal tunnel syndrome   . Diabetes mellitus without complication     Past Surgical History  Procedure Date  . Tubal ligation     No family history on file.  History  Substance Use Topics  . Smoking status: Never Smoker   . Smokeless tobacco: Never Used  . Alcohol Use: Not on file    OB History    Grav Para Term Preterm Abortions TAB SAB Ect Mult Living                  Review of Systems  Constitutional: Negative for fever.  Eyes: Negative for  visual disturbance.  Respiratory: Negative for cough and shortness of breath.   Cardiovascular: Negative for chest pain and palpitations.  Gastrointestinal: Negative for nausea, vomiting, abdominal pain and diarrhea.  Genitourinary: Negative for dysuria and pelvic pain.  Neurological: Positive for weakness.  All other systems reviewed and are negative.    Allergies  Penicillins  Home Medications   Current Outpatient Rx  Name Route Sig Dispense Refill  . ACETAMINOPHEN 500 MG PO TABS Oral Take 1,000 mg by mouth every 6 (six) hours as needed. For headache.    . METFORMIN HCL 500 MG PO TABS Oral Take 500 mg by mouth 2 (two) times daily with a meal.      . NAPROXEN 500 MG PO TABS Oral Take 1 tablet (500 mg total) by mouth 2 (two) times daily. 30 tablet 0    BP 122/76  Pulse 69  Temp 98.6 F (37 C) (Oral)  Resp 20  SpO2 100%  Physical Exam  Nursing note and vitals reviewed. Constitutional: She is oriented to person, place, and time. She appears well-developed and well-nourished. No distress.  HENT:  Head: Normocephalic and atraumatic.  Mouth/Throat: Oropharynx is clear and moist.  Eyes: Conjunctivae normal and EOM are normal. Pupils are equal, round, and reactive to light.  Neck: Normal range of motion. Neck supple. No thyromegaly present.  Cardiovascular: Normal rate, regular rhythm and intact distal pulses.   No murmur heard. Pulmonary/Chest:  Effort normal and breath sounds normal. No respiratory distress. She has no wheezes. She has no rales.  Abdominal: Soft. She exhibits no distension. There is no tenderness. There is no rebound and no guarding.  Musculoskeletal: Normal range of motion. She exhibits no edema and no tenderness.  Neurological: She is alert and oriented to person, place, and time.  Skin: Skin is warm and dry. No rash noted. No erythema.  Psychiatric: She has a normal mood and affect. Her behavior is normal.    ED Course  Procedures (including critical care  time)  Labs Reviewed  GLUCOSE, CAPILLARY - Abnormal; Notable for the following:    Glucose-Capillary 121 (*)     All other components within normal limits  COMPREHENSIVE METABOLIC PANEL - Abnormal; Notable for the following:    Potassium 3.4 (*)     Glucose, Bld 114 (*)     GFR calc non Af Amer 87 (*)     All other components within normal limits  URINALYSIS, ROUTINE W REFLEX MICROSCOPIC  PREGNANCY, URINE  CBC WITH DIFFERENTIAL  MAGNESIUM  PHOSPHORUS  TSH  T4, FREE   No results found.   No diagnosis found.    MDM   Patient with unusual symptoms of feeling jittery, difficulty focusing and not feeling herself. These episodes can last sometimes 2-3 days and he seemed to be getting worse over the last few months. Patient has had significant weight loss of greater than 40 pounds over the last 5 months. She states that she is trying to lose weight by diet and exercise. She denies any diet pills and currently is taking no medications. She at one time was on metformin but stopped taking that several months ago when the symptoms started. She checks her blood sugar when these episodes occur and it is normal every time. She saw her Dr. several months ago who wanted to do a workup however she did not have insurance and he did not want to charge her for the blood test. Concern for possible electrolyte abnormalities given the patient's excessive drinking of water however symptoms are not worse with exercise and did not improve with eating or drinking fluids. Also concern for thyroid disease. She states her thyroid was last checked over one year ago. Patient is well-appearing on exam with normal vital signs. No focal neurological findings or palpitations of an enlarged thyroid. UA and UPT are both within normal limits. Her blood sugar here is 121 and she states she just ate prior to arrival. CBC, CMP, magnesium, phosphorus, TSH, T4 are pending  9:57 PM All labs wnl.  Thyroid pending.  Will f/u  with PCP but only intaking 1300 calories daily, working out and breast feeding, feel pt may just have extreme exhaustion.      Gwyneth Sprout, MD 10/13/12 1500

## 2012-10-13 LAB — TSH: TSH: 0.864 u[IU]/mL (ref 0.350–4.500)

## 2012-11-25 ENCOUNTER — Encounter (HOSPITAL_BASED_OUTPATIENT_CLINIC_OR_DEPARTMENT_OTHER): Payer: Self-pay

## 2012-11-25 ENCOUNTER — Emergency Department (HOSPITAL_BASED_OUTPATIENT_CLINIC_OR_DEPARTMENT_OTHER)
Admission: EM | Admit: 2012-11-25 | Discharge: 2012-11-25 | Disposition: A | Discharge status: Home / Self Care | Payer: Self-pay | Attending: Emergency Medicine | Admitting: Emergency Medicine

## 2012-11-25 DIAGNOSIS — Z8669 Personal history of other diseases of the nervous system and sense organs: Secondary | ICD-10-CM | POA: Insufficient documentation

## 2012-11-25 DIAGNOSIS — R110 Nausea: Secondary | ICD-10-CM | POA: Insufficient documentation

## 2012-11-25 DIAGNOSIS — R109 Unspecified abdominal pain: Secondary | ICD-10-CM

## 2012-11-25 DIAGNOSIS — R1084 Generalized abdominal pain: Secondary | ICD-10-CM | POA: Insufficient documentation

## 2012-11-25 DIAGNOSIS — Z79899 Other long term (current) drug therapy: Secondary | ICD-10-CM | POA: Insufficient documentation

## 2012-11-25 DIAGNOSIS — M546 Pain in thoracic spine: Secondary | ICD-10-CM | POA: Insufficient documentation

## 2012-11-25 DIAGNOSIS — M549 Dorsalgia, unspecified: Secondary | ICD-10-CM

## 2012-11-25 LAB — URINE MICROSCOPIC-ADD ON

## 2012-11-25 LAB — CBC WITH DIFFERENTIAL/PLATELET
Basophils Absolute: 0 10*3/uL (ref 0.0–0.1)
Basophils Relative: 0 % (ref 0–1)
Hemoglobin: 15.2 g/dL — ABNORMAL HIGH (ref 12.0–15.0)
MCHC: 36.9 g/dL — ABNORMAL HIGH (ref 30.0–36.0)
Monocytes Relative: 6 % (ref 3–12)
Neutro Abs: 7.2 10*3/uL (ref 1.7–7.7)
Neutrophils Relative %: 85 % — ABNORMAL HIGH (ref 43–77)
Platelets: 171 10*3/uL (ref 150–400)

## 2012-11-25 LAB — COMPREHENSIVE METABOLIC PANEL
ALT: 10 U/L (ref 0–35)
AST: 15 U/L (ref 0–37)
Albumin: 4.2 g/dL (ref 3.5–5.2)
Alkaline Phosphatase: 77 U/L (ref 39–117)
Calcium: 9.2 mg/dL (ref 8.4–10.5)
Glucose, Bld: 109 mg/dL — ABNORMAL HIGH (ref 70–99)
Potassium: 3.7 mEq/L (ref 3.5–5.1)
Sodium: 139 mEq/L (ref 135–145)
Total Protein: 7.3 g/dL (ref 6.0–8.3)

## 2012-11-25 LAB — URINALYSIS, ROUTINE W REFLEX MICROSCOPIC
Bilirubin Urine: NEGATIVE
Glucose, UA: NEGATIVE mg/dL
Hgb urine dipstick: NEGATIVE
Protein, ur: NEGATIVE mg/dL
Specific Gravity, Urine: 1.03 (ref 1.005–1.030)
Urobilinogen, UA: 1 mg/dL (ref 0.0–1.0)

## 2012-11-25 LAB — LACTIC ACID, PLASMA: Lactic Acid, Venous: 0.6 mmol/L (ref 0.5–2.2)

## 2012-11-25 MED ORDER — SODIUM CHLORIDE 0.9 % IV SOLN
1000.0000 mL | Freq: Once | INTRAVENOUS | Status: AC
  Administered 2012-11-25: 1000 mL via INTRAVENOUS

## 2012-11-25 MED ORDER — SODIUM CHLORIDE 0.9 % IV SOLN
1000.0000 mL | Freq: Once | INTRAVENOUS | Status: DC
  Administered 2012-11-25: 1000 mL via INTRAVENOUS

## 2012-11-25 MED ORDER — FENTANYL CITRATE 0.05 MG/ML IJ SOLN
50.0000 ug | Freq: Once | INTRAMUSCULAR | Status: AC
  Administered 2012-11-25: 50 ug via INTRAVENOUS
  Filled 2012-11-25: qty 2

## 2012-11-25 MED ORDER — ONDANSETRON HCL 4 MG/2ML IJ SOLN
4.0000 mg | Freq: Once | INTRAMUSCULAR | Status: AC
  Administered 2012-11-25: 4 mg via INTRAVENOUS
  Filled 2012-11-25: qty 2

## 2012-11-25 MED ORDER — ONDANSETRON HCL 4 MG PO TABS: 4.0000 mg | ORAL_TABLET | Freq: Four times a day (QID) | ORAL | Status: AC

## 2012-11-25 MED ORDER — DICYCLOMINE HCL 20 MG PO TABS: 20.0000 mg | ORAL_TABLET | Freq: Four times a day (QID) | ORAL | Status: AC | PRN

## 2012-11-25 MED ORDER — SODIUM CHLORIDE 0.9 % IV SOLN: 1000.0000 mL | INTRAVENOUS | Status: DC

## 2012-11-25 NOTE — ED Notes (Signed)
Pt c/o dizziness after pain meds given. VSS. MD aware and no further orders received. Will monitor.

## 2012-11-25 NOTE — ED Notes (Signed)
Patient here with generalized abdominal pain that started yesterday around 5pm that she reports radiates through to her lower back, nausea with same. Reports urinary frequency, denies dysuria

## 2012-11-25 NOTE — ED Notes (Signed)
MD at bedside. 

## 2012-11-26 NOTE — ED Provider Notes (Signed)
History     CSN: [id number]  Arrival date & time 11/25/12  0208   First MD Initiated Contact with Patient 11/25/12 0319      Chief Complaint  Patient presents with  . Abdominal Pain    (Consider location/radiation/quality/duration/timing/severity/associated sxs/prior treatment) HPI 26 yo female presents to the ER with complaint of diffuse abd pain and upper to mid back pain starting yesterday afternoon around 5 pm. She has had nausea without vomiting.  Pain is like "back labor" Pt had pizza sticks prior to onset of sxs.  Pt reports she has been avoiding gluten over the last several months due to fatigue, weakness.  She denies previous h/o abd pain with eating gluten.  No urinary symptoms, no vaginal discharge.  No prior h/o same.  Pt with severe anxiety per nursing staff upon arrival, now improved.Pt has had multiple w/u in the past for upper abd pain, neg hida scans and u/s in the past.   Past Medical History  Diagnosis Date  . Tarsal tunnel syndrome     Past Surgical History  Procedure Date  . Tubal ligation     No family history on file.  History  Substance Use Topics  . Smoking status: Never Smoker   . Smokeless tobacco: Never Used  . Alcohol Use: Not on file    OB History    Grav Para Term Preterm Abortions TAB SAB Ect Mult Living                  Review of Systems  All other systems reviewed and are negative.    Allergies  Penicillins  Home Medications   Current Outpatient Rx  Name  Route  Sig  Dispense  Refill  . ACETAMINOPHEN 500 MG PO TABS   Oral   Take 1,000 mg by mouth every 6 (six) hours as needed. For headache.         Marland Kitchen DICYCLOMINE HCL 20 MG PO TABS   Oral   Take 1 tablet (20 mg total) by mouth every 6 (six) hours as needed (for abdominal cramping).   20 tablet   0   . ONDANSETRON HCL 4 MG PO TABS   Oral   Take 1 tablet (4 mg total) by mouth every 6 (six) hours. PRN nausea   12 tablet   0     BP 93/55  Pulse 82  Temp 98.9 F  (37.2 C) (Oral)  Resp 18  Wt 157 lb (71.215 kg)  SpO2 100%  LMP 10/28/2012  Physical Exam  Nursing note and vitals reviewed. Constitutional: She is oriented to person, place, and time. She appears well-developed and well-nourished.  HENT:  Head: Normocephalic and atraumatic.  Nose: Nose normal.  Mouth/Throat: Oropharynx is clear and moist.  Eyes: Conjunctivae normal and EOM are normal. Pupils are equal, round, and reactive to light.  Neck: Normal range of motion. Neck supple. No JVD present. No tracheal deviation present. No thyromegaly present.  Cardiovascular: Normal rate, regular rhythm, normal heart sounds and intact distal pulses.  Exam reveals no gallop and no friction rub.   No murmur heard. Pulmonary/Chest: Effort normal and breath sounds normal. No stridor. No respiratory distress. She has no wheezes. She has no rales. She exhibits no tenderness.  Abdominal: Soft. Bowel sounds are normal. She exhibits no distension and no mass. There is tenderness (diffuse mild ttp). There is no rebound and no guarding.  Musculoskeletal: Normal range of motion. She exhibits no edema and no tenderness.  Lymphadenopathy:  She has no cervical adenopathy.  Neurological: She is alert and oriented to person, place, and time. She exhibits normal muscle tone. Coordination normal.  Skin: Skin is dry. No rash noted. No erythema. No pallor.  Psychiatric: She has a normal mood and affect. Her behavior is normal. Judgment and thought content normal.    ED Course  Procedures (including critical care time)  Labs Reviewed  URINALYSIS, ROUTINE W REFLEX MICROSCOPIC - Abnormal; Notable for the following:    Ketones, ur 15 (*)     Leukocytes, UA SMALL (*)     All other components within normal limits  URINE MICROSCOPIC-ADD ON - Abnormal; Notable for the following:    Squamous Epithelial / LPF FEW (*)     Bacteria, UA FEW (*)     All other components within normal limits  CBC WITH DIFFERENTIAL -  Abnormal; Notable for the following:    Hemoglobin 15.2 (*)     MCHC 36.9 (*)     Neutrophils Relative 85 (*)     Lymphocytes Relative 7 (*)     Lymphs Abs 0.6 (*)     All other components within normal limits  COMPREHENSIVE METABOLIC PANEL - Abnormal; Notable for the following:    Glucose, Bld 109 (*)     All other components within normal limits  PREGNANCY, URINE  LACTIC ACID, PLASMA  LIPASE, BLOOD   No results found.   1. Abdominal pain, acute   2. Nausea   3. Back pain       MDM  26 yo female with abd/back pain.  Labs unremarkable, pt feeling better after fluids, medications. Will d/c to f/u with pcm.        Olivia Mackie, MD 11/26/12 [phone]

## 2016-07-10 ENCOUNTER — Emergency Department (HOSPITAL_BASED_OUTPATIENT_CLINIC_OR_DEPARTMENT_OTHER)
Admission: EM | Admit: 2016-07-10 | Discharge: 2016-07-10 | Disposition: A | Discharge status: Home / Self Care | Payer: Self-pay | Attending: Emergency Medicine | Admitting: Emergency Medicine

## 2016-07-10 ENCOUNTER — Emergency Department (HOSPITAL_BASED_OUTPATIENT_CLINIC_OR_DEPARTMENT_OTHER): Payer: Self-pay

## 2016-07-10 ENCOUNTER — Encounter (HOSPITAL_BASED_OUTPATIENT_CLINIC_OR_DEPARTMENT_OTHER): Payer: Self-pay | Admitting: *Deleted

## 2016-07-10 DIAGNOSIS — R109 Unspecified abdominal pain: Secondary | ICD-10-CM | POA: Insufficient documentation

## 2016-07-10 DIAGNOSIS — R102 Pelvic and perineal pain: Secondary | ICD-10-CM

## 2016-07-10 DIAGNOSIS — N939 Abnormal uterine and vaginal bleeding, unspecified: Secondary | ICD-10-CM | POA: Insufficient documentation

## 2016-07-10 DIAGNOSIS — N838 Other noninflammatory disorders of ovary, fallopian tube and broad ligament: Secondary | ICD-10-CM | POA: Insufficient documentation

## 2016-07-10 LAB — URINALYSIS, ROUTINE W REFLEX MICROSCOPIC
Bilirubin Urine: NEGATIVE
GLUCOSE, UA: NEGATIVE mg/dL
HGB URINE DIPSTICK: NEGATIVE
Ketones, ur: NEGATIVE mg/dL
LEUKOCYTES UA: NEGATIVE
Nitrite: NEGATIVE
PROTEIN: NEGATIVE mg/dL
SPECIFIC GRAVITY, URINE: 1.024 (ref 1.005–1.030)
pH: 7 (ref 5.0–8.0)

## 2016-07-10 LAB — CBC WITH DIFFERENTIAL/PLATELET
Basophils Absolute: 0 10*3/uL (ref 0.0–0.1)
Basophils Relative: 1 %
Eosinophils Absolute: 0 10*3/uL (ref 0.0–0.7)
Eosinophils Relative: 1 %
HEMATOCRIT: 40.6 % (ref 36.0–46.0)
HEMOGLOBIN: 14.2 g/dL (ref 12.0–15.0)
LYMPHS ABS: 1.8 10*3/uL (ref 0.7–4.0)
LYMPHS PCT: 34 %
MCH: 32.1 pg (ref 26.0–34.0)
MCHC: 35 g/dL (ref 30.0–36.0)
MCV: 91.6 fL (ref 78.0–100.0)
Monocytes Absolute: 0.5 10*3/uL (ref 0.1–1.0)
Monocytes Relative: 10 %
NEUTROS ABS: 2.8 10*3/uL (ref 1.7–7.7)
NEUTROS PCT: 54 %
Platelets: 212 10*3/uL (ref 150–400)
RBC: 4.43 MIL/uL (ref 3.87–5.11)
RDW: 12.1 % (ref 11.5–15.5)
WBC: 5.1 10*3/uL (ref 4.0–10.5)

## 2016-07-10 LAB — COMPREHENSIVE METABOLIC PANEL
ALK PHOS: 51 U/L (ref 38–126)
ALT: 14 U/L (ref 14–54)
AST: 20 U/L (ref 15–41)
Albumin: 4.2 g/dL (ref 3.5–5.0)
Anion gap: 7 (ref 5–15)
BUN: 16 mg/dL (ref 6–20)
CALCIUM: 9 mg/dL (ref 8.9–10.3)
CO2: 26 mmol/L (ref 22–32)
CREATININE: 0.65 mg/dL (ref 0.44–1.00)
Chloride: 104 mmol/L (ref 101–111)
Glucose, Bld: 105 mg/dL — ABNORMAL HIGH (ref 65–99)
Potassium: 4.1 mmol/L (ref 3.5–5.1)
Sodium: 137 mmol/L (ref 135–145)
Total Bilirubin: 0.6 mg/dL (ref 0.3–1.2)
Total Protein: 6.8 g/dL (ref 6.5–8.1)

## 2016-07-10 LAB — WET PREP, GENITAL
Clue Cells Wet Prep HPF POC: NONE SEEN
Sperm: NONE SEEN
TRICH WET PREP: NONE SEEN
YEAST WET PREP: NONE SEEN

## 2016-07-10 LAB — PREGNANCY, URINE: Preg Test, Ur: NEGATIVE

## 2016-07-10 NOTE — ED Triage Notes (Signed)
Pt reports vaginal bleeding that began around 0800 this morning -- states this is unusual for her because her periods are usually predictable around every 28 days. States first date of last menstrual cycle was approx 06/24/2016. Reports mild lower abdominal cramping. Denies fever, n/v/d. States bleeding is light.

## 2016-07-10 NOTE — Discharge Instructions (Signed)
Call your OBGYN office tomorrow morning to schedule a follow up appointment regarding further management of your irregular vaginal bleeding and possible need to start birth control. You may take 600mg  Ibuprofen every 6 hours as needed for abdominal cramping. Please return to the Emergency Department if symptoms worsen or new onset of fever, abdominal pain, vomiting, worsening vaginal bleeding, lightheadedness, dizziness, syncope.

## 2016-07-10 NOTE — ED Provider Notes (Signed)
[facility] Provider Note   CSN: [id number] Arrival date & time: 07/10/16  1478  First Provider Contact:  None       History   Chief Complaint Chief Complaint  Patient presents with  . Vaginal Bleeding    HPI Sheila Cross is a 30 y.o. female.  Patient is a 30 year old female with past medical history of tubal ligation and 5 miscarriages who presents the ED with complaint of vaginal bleeding, onset this morning. Patient reports having light spotting this morning. She notes she is not due to have her menstrual cycle and reports typically having irregular menstrual however 28 days. LMP 06/24/16. Patient also endorses having mild cramping to her lower abdomen. Denies fever, chills, chest pain, shortness of breath, nausea, vomiting, diarrhea, urinary symptoms, vaginal discharge. Patient reports being sexually active with one partner, denies using condoms. Patient denies taking any medications prior to arrival. 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    Vaginal Bleeding  Primary symptoms include vaginal bleeding. Associated symptoms include abdominal pain.    Past Medical History:  Diagnosis Date  . Tarsal tunnel syndrome     There are no active problems to display for this patient.   Past Surgical History:  Procedure Laterality Date  . TUBAL LIGATION      OB History    No data available       Home Medications    Prior to Admission medications   Medication Sig Start Date End Date Taking? Authorizing Provider  acetaminophen (TYLENOL) 500 MG tablet Take 1,000 mg by mouth every 6 (six) hours as needed. For headache.    Historical Provider, MD  dicyclomine (BENTYL) 20 MG tablet Take 1 tablet (20 mg total) by mouth every 6 (six) hours as needed (for abdominal cramping). 11/25/12   Marisa Severin, MD  ondansetron (ZOFRAN) 4 MG tablet Take 1 tablet (4 mg total) by mouth every 6 (six) hours. PRN nausea 11/25/12    Marisa Severin, MD    Family History No family history on file.  Social History Social History  Substance Use Topics  . Smoking status: Never Smoker  . Smokeless tobacco: Never Used  . Alcohol use Yes     Comment: once/week     Allergies   Penicillins   Review of Systems Review of Systems  Gastrointestinal: Positive for abdominal pain.  Genitourinary: Positive for vaginal bleeding.  All other systems reviewed and are negative.    Physical Exam Updated Vital Signs BP 121/80 (BP Location: Right Arm)   Pulse 70   Temp 98.6 F (37 C) (Oral)   Resp 18   Ht  (1.727 m)   Wt 61.2 kg   LMP 06/24/2016 (Approximate)   SpO2 99%   BMI 20.53 kg/m   Physical Exam  Constitutional: She is oriented to person, place, and time. She appears well-developed and well-nourished. No distress.  HENT:  Head: Normocephalic and atraumatic.  Mouth/Throat: Oropharynx is clear and moist. No oropharyngeal exudate.  Eyes: Conjunctivae and EOM are normal. Right eye exhibits no discharge. Left eye exhibits no discharge. No scleral icterus.  Neck: Normal range of motion. Neck supple.  Cardiovascular: Normal rate, regular rhythm, normal heart sounds and intact distal pulses.   Pulmonary/Chest: Effort normal and breath sounds normal. No respiratory distress. She has no wheezes. She has no rales. She exhibits no tenderness.  Abdominal: Soft. Bowel sounds are normal. She exhibits no distension and no  mass. There is tenderness (RLQ and suprapubic region). There is no rebound and no guarding. No hernia.  Musculoskeletal: She exhibits no edema.  Neurological: She is alert and oriented to person, place, and time.  Skin: Skin is warm and dry. She is not diaphoretic.  Nursing note and vitals reviewed.  Pelvic exam: normal external genitalia, vulva, vagina, cervix, uterus and adnexa, VULVA: normal appearing vulva with no masses, tenderness or lesions, VAGINA: normal appearing vagina with normal color and  discharge, no lesions, CERVIX: normal appearing cervix without discharge or lesions, WET MOUNT done - results: white blood cells, DNA probe for chlamydia and GC obtained, UTERUS: uterus is normal size, shape, consistency and nontender, ADNEXA: normal adnexa in size, nontender and no masses, tenderness right, exam chaperoned by female nurse.   ED Treatments / Results  Labs (all labs ordered are listed, but only abnormal results are displayed) Labs Reviewed  WET PREP, GENITAL - Abnormal; Notable for the following:       Result Value   WBC, Wet Prep HPF POC MODERATE (*)    All other components within normal limits  COMPREHENSIVE METABOLIC PANEL - Abnormal; Notable for the following:    Glucose, Bld 105 (*)    All other components within normal limits  URINALYSIS, ROUTINE W REFLEX MICROSCOPIC (NOT AT [facility]) - Abnormal; Notable for the following:    APPearance CLOUDY (*)    All other components within normal limits  PREGNANCY, URINE  CBC WITH DIFFERENTIAL/PLATELET  GC/CHLAMYDIA PROBE AMP ([place]) NOT AT [facility]    EKG  EKG Interpretation None       Radiology US Transvaginal Non-ob  Result Date: 07/10/2016 CLINICAL DATA:  Patient with vaginal bleeding and pelvic cramping. EXAM: TRANSABDOMINAL AND TRANSVAGINAL ULTRASOUND OF PELVIS TECHNIQUE: Both transabdominal and transvaginal ultrasound examinations of the pelvis were performed. Transabdominal technique was performed for global imaging of the pelvis including uterus, ovaries, adnexal regions, and pelvic cul-de-sac. It was necessary to proceed with endovaginal exam following the transabdominal exam to visualize the adnexal structures and endometrium. COMPARISON:  CT abdomen pelvis 05/24/2011 FINDINGS: Uterus Measurements: 8.0 x 4.0 x 4.5 cm. Within the left aspect of the uterine fundus there is suggestion of an intramural fibroid measuring 1.1 x 1.1 x 1.1 cm. Endometrium Thickness: 5 mm.  No focal abnormality visualized. Right ovary  Measurements: 3.9 x 1.9 x 1.9 cm. Normal appearance/no adnexal mass. Left ovary Measurements: 3.7 x 4.0 x 4.8 cm. Within the left ovary there is a 2.4 x 3.5 x 2.9 cm simple cyst. Other findings No abnormal free fluid. IMPRESSION: Endometrium measures 5 mm. If bleeding remains unresponsive to hormonal or medical therapy, sonohysterogram should be considered for focal lesion work-up. (Ref: Radiological Reasoning: Algorithmic Workup of Abnormal Vaginal Bleeding with Endovaginal Sonography and Sonohysterography. AJR 2008; 782:N56-21) Probable small intramural fibroid within the uterine fundus. Electronically Signed   By: Annia Belt M.D.   On: 07/10/2016 14:52  US Pelvis Complete  Result Date: 07/10/2016 CLINICAL DATA:  Patient with vaginal bleeding and pelvic cramping. EXAM: TRANSABDOMINAL AND TRANSVAGINAL ULTRASOUND OF PELVIS TECHNIQUE: Both transabdominal and transvaginal ultrasound examinations of the pelvis were performed. Transabdominal technique was performed for global imaging of the pelvis including uterus, ovaries, adnexal regions, and pelvic cul-de-sac. It was necessary to proceed with endovaginal exam following the transabdominal exam to visualize the adnexal structures and endometrium. COMPARISON:  CT abdomen pelvis 05/24/2011 FINDINGS: Uterus Measurements: 8.0 x 4.0 x 4.5 cm. Within the left aspect of the uterine fundus there  is suggestion of an intramural fibroid measuring 1.1 x 1.1 x 1.1 cm. Endometrium Thickness: 5 mm.  No focal abnormality visualized. Right ovary Measurements: 3.9 x 1.9 x 1.9 cm. Normal appearance/no adnexal mass. Left ovary Measurements: 3.7 x 4.0 x 4.8 cm. Within the left ovary there is a 2.4 x 3.5 x 2.9 cm simple cyst. Other findings No abnormal free fluid. IMPRESSION: Endometrium measures 5 mm. If bleeding remains unresponsive to hormonal or medical therapy, sonohysterogram should be considered for focal lesion work-up. (Ref: Radiological Reasoning: Algorithmic Workup of  Abnormal Vaginal Bleeding with Endovaginal Sonography and Sonohysterography. AJR 2008; 993:Z16-96) Probable small intramural fibroid within the uterine fundus. Electronically Signed   By: Annia Belt M.D.   On: 07/10/2016 14:52   Procedures Procedures (including critical care time)  Medications Ordered in ED Medications - No data to display   Initial Impression / Assessment and Plan / ED Course  I have reviewed the triage vital signs and the nursing notes.  Pertinent labs & imaging results that were available during my care of the patient were reviewed by me and considered in my medical decision making (see chart for details).  Clinical Course    Patient presents with vaginal spotting that started this morning with associated mild abdominal cramping. Patient reports she is not due for her usual menstrual cycle and notes her LMP was 06/24/16. Hx of tubal ligation. Denies fever or abdominal pain. VSS. Exam revealed mild tenderness and right lower quadrant and suprapubic region, no peritoneal signs. Pregnancy negative. Pelvic exam showed no blood in vaginal vault, no CMT, mild right adnexal tenderness. Patient declined pain meds. Will order pelvic ultrasound for further evaluation. Labs and urine unremarkable. Ultrasound revealed endometrium at 43mm, 1cm intrauterine fibroid and simple left ovarian cyst. On reevaluation patient reports having mild discomfort but continues to decline any pain medications. Discussed results with patient with option of starting patient on birth control and following up with OB/GYN. Patient reports she would prefer to follow up with her gynecologist prior to initiating birth control. Due to patient with light vaginal spotting and remaining hemodynamically stable in the ED at [facility] is appropriate. Plan for discharge patient home with symptomatic treatment and OB/GYN follow-up. Discussed return precautions with patient.  Final Clinical Impressions(s) / ED Diagnoses    Final diagnoses:  Right adnexal tenderness  Vaginal bleeding    New Prescriptions Discharge Medication List as of 07/10/2016  3:49 PM       Barrett Henle, PA-C 07/10/16 1642    Marily Memos, MD 07/11/16 1359

## 2016-07-11 LAB — GC/CHLAMYDIA PROBE AMP (~~LOC~~) NOT AT ARMC
CHLAMYDIA, DNA PROBE: NEGATIVE
NEISSERIA GONORRHEA: NEGATIVE

## 2016-08-12 ENCOUNTER — Encounter (HOSPITAL_BASED_OUTPATIENT_CLINIC_OR_DEPARTMENT_OTHER): Payer: Self-pay | Admitting: *Deleted

## 2016-08-12 ENCOUNTER — Emergency Department (HOSPITAL_BASED_OUTPATIENT_CLINIC_OR_DEPARTMENT_OTHER)
Admission: EM | Admit: 2016-08-12 | Discharge: 2016-08-12 | Disposition: A | Discharge status: Home / Self Care | Payer: No Typology Code available for payment source | Attending: Emergency Medicine | Admitting: Emergency Medicine

## 2016-08-12 ENCOUNTER — Emergency Department (HOSPITAL_BASED_OUTPATIENT_CLINIC_OR_DEPARTMENT_OTHER): Payer: No Typology Code available for payment source

## 2016-08-12 DIAGNOSIS — F0781 Postconcussional syndrome: Secondary | ICD-10-CM

## 2016-08-12 DIAGNOSIS — Y999 Unspecified external cause status: Secondary | ICD-10-CM | POA: Insufficient documentation

## 2016-08-12 DIAGNOSIS — Y92410 Unspecified street and highway as the place of occurrence of the external cause: Secondary | ICD-10-CM | POA: Diagnosis not present

## 2016-08-12 DIAGNOSIS — Y9389 Activity, other specified: Secondary | ICD-10-CM | POA: Diagnosis not present

## 2016-08-12 DIAGNOSIS — M25512 Pain in left shoulder: Secondary | ICD-10-CM

## 2016-08-12 LAB — PREGNANCY, URINE: Preg Test, Ur: NEGATIVE
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
## 2016-08-12 NOTE — Discharge Instructions (Signed)
400-600 mg ibuprofen up to three times a day as needed for pain.  Ice affected area as needed for additional pain relief.  Rest. Increase hydration. No intense activity, minimize screens (TV, phones, etc).  Follow up with primary physician if symptoms last longer than one week.   COLD THERAPY DIRECTIONS:  Ice or gel packs can be used to reduce both pain and swelling. Ice is the most helpful within the first 24 to 48 hours after an injury or flareup from overusing a muscle or joint.  Ice is effective, has very few side effects, and is safe for most people to use.   If you expose your skin to cold temperatures for too long or without the proper protection, you can damage your skin or nerves. Watch for signs of skin damage due to cold.   HOME CARE INSTRUCTIONS  Follow these tips to use ice and cold packs safely.  Place a dry or damp towel between the ice and skin. A damp towel will cool the skin more quickly, so you may need to shorten the time that the ice is used.  For a more rapid response, add gentle compression to the ice.  Ice for no more than 10 to 20 minutes at a time. The bonier the area you are icing, the less time it will take to get the benefits of ice.  Check your skin after 5 minutes to make sure there are no signs of a poor response to cold or skin damage.  Rest 20 minutes or more in between uses.  Once your skin is numb, you can end your treatment. You can test numbness by very lightly touching your skin. The touch should be so light that you do not see the skin dimple from the pressure of your fingertip. When using ice, most people will feel these normal sensations in this order: cold, burning, aching, and numbness.
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
## 2016-09-08 ENCOUNTER — Emergency Department (HOSPITAL_BASED_OUTPATIENT_CLINIC_OR_DEPARTMENT_OTHER): Payer: No Typology Code available for payment source

## 2016-09-08 ENCOUNTER — Encounter (HOSPITAL_BASED_OUTPATIENT_CLINIC_OR_DEPARTMENT_OTHER): Payer: Self-pay | Admitting: *Deleted

## 2016-09-08 ENCOUNTER — Emergency Department (HOSPITAL_BASED_OUTPATIENT_CLINIC_OR_DEPARTMENT_OTHER)
Admission: EM | Admit: 2016-09-08 | Discharge: 2016-09-08 | Disposition: A | Discharge status: Home / Self Care | Payer: No Typology Code available for payment source | Attending: Emergency Medicine | Admitting: Emergency Medicine

## 2016-09-08 DIAGNOSIS — Y9389 Activity, other specified: Secondary | ICD-10-CM | POA: Insufficient documentation

## 2016-09-08 DIAGNOSIS — G44309 Post-traumatic headache, unspecified, not intractable: Secondary | ICD-10-CM | POA: Diagnosis not present

## 2016-09-08 DIAGNOSIS — Y999 Unspecified external cause status: Secondary | ICD-10-CM | POA: Diagnosis not present

## 2016-09-08 DIAGNOSIS — F0781 Postconcussional syndrome: Secondary | ICD-10-CM | POA: Diagnosis not present

## 2016-09-08 DIAGNOSIS — R51 Headache: Secondary | ICD-10-CM | POA: Diagnosis present

## 2016-09-08 DIAGNOSIS — Y92410 Unspecified street and highway as the place of occurrence of the external cause: Secondary | ICD-10-CM | POA: Insufficient documentation

## 2016-09-08 LAB — PREGNANCY, URINE: Preg Test, Ur: NEGATIVE

## 2016-09-08 MED ORDER — BUTALBITAL-APAP-CAFFEINE 50-325-40 MG PO TABS: 1.0000 | ORAL_TABLET | Freq: Four times a day (QID) | ORAL | 0 refills | Status: AC | PRN

## 2016-09-08 MED ORDER — KETOROLAC TROMETHAMINE 30 MG/ML IJ SOLN
30.0000 mg | Freq: Once | INTRAMUSCULAR | Status: AC
  Administered 2016-09-08: 30 mg via INTRAVENOUS
  Filled 2016-09-08: qty 1

## 2016-09-08 MED ORDER — DIPHENHYDRAMINE HCL 50 MG/ML IJ SOLN
50.0000 mg | Freq: Once | INTRAMUSCULAR | Status: AC
  Administered 2016-09-08: 50 mg via INTRAVENOUS
  Filled 2016-09-08: qty 1

## 2016-09-08 MED ORDER — SODIUM CHLORIDE 0.9 % IV BOLUS (SEPSIS)
1000.0000 mL | Freq: Once | INTRAVENOUS | Status: AC
  Administered 2016-09-08: 1000 mL via INTRAVENOUS

## 2016-09-08 MED ORDER — METOCLOPRAMIDE HCL 5 MG/ML IJ SOLN
10.0000 mg | Freq: Once | INTRAMUSCULAR | Status: AC
  Administered 2016-09-08: 10 mg via INTRAVENOUS
  Filled 2016-09-08: qty 2

## 2016-09-08 MED ORDER — PROMETHAZINE HCL 25 MG PO TABS: 25.0000 mg | ORAL_TABLET | Freq: Four times a day (QID) | ORAL | 0 refills | Status: AC | PRN

## 2016-09-08 NOTE — ED Notes (Signed)
Pt verbalizes understanding of d/c instructions and denies any further needs at this time. 
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
## 2016-09-22 ENCOUNTER — Ambulatory Visit: Payer: Self-pay | Admitting: Neurology

## 2016-09-22 ENCOUNTER — Telehealth: Payer: Self-pay | Admitting: *Deleted

## 2016-09-22 NOTE — Telephone Encounter (Signed)
no showed new patient appt 

## 2016-09-26 ENCOUNTER — Encounter: Payer: Self-pay | Admitting: Neurology
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
## 2018-07-27 ENCOUNTER — Encounter (HOSPITAL_COMMUNITY): Payer: Self-pay | Admitting: Emergency Medicine

## 2018-07-27 ENCOUNTER — Emergency Department (HOSPITAL_COMMUNITY)
Admission: EM | Admit: 2018-07-27 | Discharge: 2018-07-27 | Disposition: A | Discharge status: Home / Self Care | Payer: Self-pay | Attending: Emergency Medicine | Admitting: Emergency Medicine

## 2018-07-27 DIAGNOSIS — E876 Hypokalemia: Secondary | ICD-10-CM

## 2018-07-27 DIAGNOSIS — R55 Syncope and collapse: Secondary | ICD-10-CM | POA: Insufficient documentation

## 2018-07-27 LAB — BASIC METABOLIC PANEL
Anion gap: 8 (ref 5–15)
BUN: 7 mg/dL (ref 6–20)
CO2: 25 mmol/L (ref 22–32)
Calcium: 8.7 mg/dL — ABNORMAL LOW (ref 8.9–10.3)
Chloride: 107 mmol/L (ref 98–111)
Creatinine, Ser: 0.68 mg/dL (ref 0.44–1.00)
GFR calc Af Amer: >60 mL/min (ref >60)
GFR calc non Af Amer: >60 mL/min (ref >60)
GLUCOSE: 98 mg/dL (ref 70–99)
POTASSIUM: 3.1 mmol/L — AB (ref 3.5–5.1)
Sodium: 140 mmol/L (ref 135–145)

## 2018-07-27 LAB — URINALYSIS, ROUTINE W REFLEX MICROSCOPIC
BILIRUBIN URINE: NEGATIVE
GLUCOSE, UA: NEGATIVE mg/dL
HGB URINE DIPSTICK: NEGATIVE
Ketones, ur: 5 mg/dL — AB
Leukocytes, UA: NEGATIVE
Nitrite: NEGATIVE
PH: 7 (ref 5.0–8.0)
Protein, ur: NEGATIVE mg/dL
SPECIFIC GRAVITY, URINE: 1.003 — AB (ref 1.005–1.030)

## 2018-07-27 LAB — CBC
HEMATOCRIT: 38.7 % (ref 36.0–46.0)
HEMOGLOBIN: 13.2 g/dL (ref 12.0–15.0)
MCH: 31.7 pg (ref 26.0–34.0)
MCHC: 34.1 g/dL (ref 30.0–36.0)
MCV: 93 fL (ref 78.0–100.0)
Platelets: 180 10*3/uL (ref 150–400)
RBC: 4.16 MIL/uL (ref 3.87–5.11)
RDW: 11.5 % (ref 11.5–15.5)
WBC: 5.7 10*3/uL (ref 4.0–10.5)

## 2018-07-27 LAB — I-STAT BETA HCG BLOOD, ED (MC, WL, AP ONLY): I-stat hCG, quantitative: 5.5 m[IU]/mL — ABNORMAL HIGH (ref <5)

## 2018-07-27 LAB — WET PREP, GENITAL
Sperm: NONE SEEN
Trich, Wet Prep: NONE SEEN
Yeast Wet Prep HPF POC: NONE SEEN

## 2018-07-27 MED ORDER — MAGNESIUM SULFATE 2 GM/50ML IV SOLN
2.0000 g | Freq: Once | INTRAVENOUS | Status: AC
  Administered 2018-07-27: 2 g via INTRAVENOUS
  Filled 2018-07-27: qty 50

## 2018-07-27 MED ORDER — METRONIDAZOLE 500 MG PO TABS: 500.0000 mg | ORAL_TABLET | Freq: Two times a day (BID) | ORAL | 0 refills | Status: AC

## 2018-07-27 MED ORDER — SODIUM CHLORIDE 0.9 % IV BOLUS
1000.0000 mL | Freq: Once | INTRAVENOUS | Status: DC
Start: 2018-07-27 — End: 2018-07-27

## 2018-07-27 MED ORDER — ACETAMINOPHEN 325 MG PO TABS
650.0000 mg | ORAL_TABLET | Freq: Once | ORAL | Status: AC
  Administered 2018-07-27: 650 mg via ORAL
  Filled 2018-07-27: qty 2

## 2018-07-27 MED ORDER — SODIUM CHLORIDE 0.9 % IV BOLUS
1000.0000 mL | Freq: Once | INTRAVENOUS | Status: AC
  Administered 2018-07-27: 1000 mL via INTRAVENOUS

## 2018-07-27 MED ORDER — POTASSIUM CHLORIDE CRYS ER 20 MEQ PO TBCR
40.0000 meq | EXTENDED_RELEASE_TABLET | Freq: Once | ORAL | Status: AC
  Administered 2018-07-27: 40 meq via ORAL
  Filled 2018-07-27: qty 2

## 2018-07-27 NOTE — ED Provider Notes (Signed)
[facility]   CSN: [id number] Arrival date & time: 07/27/18  1220     History   Chief Complaint Chief Complaint  Patient presents with  . Near Syncope    HPI Sheila Cross is a 32 y.o. female.  32 y.o female with no PMH presents to the ED with a chief complaint of near syncope x 4 hours ago. 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 Patient states she held onto the bar and had people help her sit down. Patient states [place] and she's gotten approximately 3-4 hours of sleep in the past couple of days. She also reports some her last full meal being last night at 10 pm, patient states she did not have breakfast this morning and was in the middle of drinking a protein shake. Patient reports a headache, describing it as heavy on the lower aspect of her neck. She denies any fever,recent illness, uri symptoms, chest pain , shortness of breath or urinary complaints.      Past Medical History:  Diagnosis Date  . Tarsal tunnel syndrome     There are no active problems to display for this patient.   Past Surgical History:  Procedure Laterality Date  . TUBAL LIGATION       OB History   None      Home Medications    Prior to Admission medications   Medication Sig Start Date End Date Taking? Authorizing Provider  acetaminophen (TYLENOL) 500 MG tablet Take 1,000 mg by mouth every 6 (six) hours as needed. For headache.    [provider]  dicyclomine (BENTYL) 20 MG tablet Take 1 tablet (20 mg total) by mouth every 6 (six) hours as needed (for abdominal cramping). 11/25/12   Marisa Severintter, Olga, MD  ondansetron (ZOFRAN) 4 MG tablet Take 1 tablet (4 mg total) by mouth every 6 (six) hours. PRN nausea 11/25/12   Marisa Severintter, Olga, MD  promethazine (PHENERGAN) 25 MG tablet Take 1 tablet (25 mg total) by mouth every 6 (six) hours as  needed for nausea or vomiting. 09/08/16   Ward, Layla MawKristen N, DO    Family History No family history on file.  Social History Social History   Tobacco Use  . Smoking status: Never Smoker  . Smokeless tobacco: Never Used  Substance Use Topics  . Alcohol use: Yes    Comment: once/week  . Drug use: No     Allergies   Patient has no known allergies.   Review of Systems Review of Systems  Constitutional: Negative for chills and fever.  HENT: Negative for ear pain and sore throat.   Eyes: Negative for pain and visual disturbance.  Respiratory: Negative for cough and shortness of breath.   Cardiovascular: Negative for chest pain and palpitations.  Gastrointestinal: Negative for abdominal pain, diarrhea, nausea and vomiting.  Genitourinary: Negative for dysuria and hematuria.  Musculoskeletal: Negative for arthralgias and back pain.  Skin: Negative for color change and rash.  Neurological: Positive for syncope (near syncope, did not fall, no LOC) and headaches. Negative for seizures.  All other systems reviewed and are negative.    Physical Exam Updated Vital Signs BP 105/62   Pulse (!) 56   Temp 98.7 F (37.1 C) (Oral)   Resp 16   SpO2 100%   Physical Exam  Constitutional: She is oriented to person, place, and time.  She appears well-developed and well-nourished.  HENT:  Head: Normocephalic and atraumatic.    Headache, feels like heaviness. No neck rigidity no fever.  Neck: Normal range of motion. Neck supple.  Cardiovascular: Normal heart sounds.  Pulmonary/Chest: Effort normal and breath sounds normal. She has no wheezes.  Abdominal: Soft. Bowel sounds are normal. She exhibits no distension. There is no tenderness.  Genitourinary: There is no rash, tenderness, lesion or injury on the right labia. There is no rash, tenderness, lesion or injury on the left labia.  Musculoskeletal: She exhibits no tenderness or deformity.  Neurological: She is alert and oriented to  person, place, and time. She has normal strength. GCS eye subscore is 4. GCS verbal subscore is 5. GCS motor subscore is 6.  Skin: Skin is warm and dry.  Nursing note and vitals reviewed.    ED Treatments / Results  Labs (all labs ordered are listed, but only abnormal results are displayed) Labs Reviewed  BASIC METABOLIC PANEL - Abnormal; Notable for the following components:      Result Value   Potassium 3.1 (*)    Calcium 8.7 (*)    All other components within normal limits  URINALYSIS, ROUTINE W REFLEX MICROSCOPIC - Abnormal; Notable for the following components:   Color, Urine STRAW (*)    Specific Gravity, Urine 1.003 (*)    Ketones, ur 5 (*)    All other components within normal limits  I-STAT BETA HCG BLOOD, ED ([facility], [facility], [facility] ONLY) - Abnormal; Notable for the following components:   I-stat hCG, quantitative 5.5 (*)    All other components within normal limits  CBC  CBG MONITORING, ED    EKG EKG Interpretation  Date/Time:  Friday July 27 2018 12:31:02 EDT Ventricular Rate:  68 PR Interval:  140 QRS Duration: 88 QT Interval:  424 QTC Calculation: 450 R Axis:   91 Text Interpretation:  Normal sinus rhythm with sinus arrhythmia Rightward axis Borderline ECG No old tracing to compare Confirmed by Marily Memos [phone]) on 07/27/2018 3:38:17 PM   Radiology No results found.  Procedures Procedures (including critical care time)  Medications Ordered in ED Medications  sodium chloride 0.9 % bolus 1,000 mL (1,000 mLs Intravenous New Bag/Given 07/27/18 1557)  acetaminophen (TYLENOL) tablet 650 mg (650 mg Oral Given 07/27/18 1603)     Initial Impression / Assessment and Plan / ED Course  I have reviewed the triage vital signs and the nursing notes.  Pertinent labs & imaging results that were available during my care of the patient were reviewed by me and considered in my medical decision making (see chart for details).     CBC showed no leukocytosis, within normal  range.  BMP shows slight decrease in potassium 3.1,will replace while in the ED along with Mg. Calcium is also slightly increased at 8.7. UA showed no ketones, nitrates, leukocytosis she does not complain of any urinary symptoms. The hCG 5.5, she had a tubal ligation. EKG showed no STEMI or infarct.  Patient told RN Hannie she might have forgotten a tampon inside her vagina.Pelvic exam was done no foreign body was seen with speculum. Patient is currently afebrile. Patient states they flew from [place] yesterday and she went and worked after, she states she's been getting 3-4 of sleep recently. Patient denies any shortness of breath of calf tenderness, not tachycardic,  Wells score is 0.0 low risk group.   I have replaced patient's K+ and Mg, Patient states she feels better after fluids. Wet  prep showed clue cells along with WBC will treat patient for BV with flagyl. I have advised patient to establish care with a PCP. Return precautions provided.    Final Clinical Impressions(s) / ED Diagnoses   Final diagnoses:  Near syncope    ED Discharge Orders    None       Claude Manges, Cordelia Poche 07/27/18 Mal Amabile, MD 07/28/18 1946

## 2018-07-27 NOTE — Discharge Instructions (Addendum)
I have prescribed antibiotics, please take as directed.Do not drink alcohol while taking these antibiotics as it will cause nausea and vomiting.Please follow up with PCP for reevaluation of symptoms in 1 week.

## 2018-07-27 NOTE — ED Triage Notes (Signed)
Patient was at work when she suddenly felt very weak and dizzy, stating she felt like she was going to pass out. Denies complete loss of consciousness. Denies fall. Only complaint at this time patient states her head feels heavy. Denies pain and nausea. Patient alert, oriented, and in no apparent distress at this time. 18g saline lock in left AC.

## 2018-07-30 LAB — GC/CHLAMYDIA PROBE AMP (~~LOC~~) NOT AT ARMC
Chlamydia: NEGATIVE
NEISSERIA GONORRHEA: NEGATIVE

## 2018-07-31 NOTE — ED Notes (Signed)
07/31/2018., Pt. Called for results of her STD cultures.  Reviewed results with pt. All questions answered.

## 2022-02-03 DIAGNOSIS — R5383 Other fatigue: Secondary | ICD-10-CM | POA: Diagnosis not present

## 2022-02-17 DIAGNOSIS — R0789 Other chest pain: Secondary | ICD-10-CM | POA: Diagnosis not present

## 2022-02-17 DIAGNOSIS — R000 Tachycardia, unspecified: Secondary | ICD-10-CM | POA: Diagnosis not present

## 2022-02-17 DIAGNOSIS — R002 Palpitations: Secondary | ICD-10-CM | POA: Diagnosis not present

## 2022-02-17 DIAGNOSIS — G4719 Other hypersomnia: Secondary | ICD-10-CM | POA: Diagnosis not present

## 2022-02-17 DIAGNOSIS — R5383 Other fatigue: Secondary | ICD-10-CM | POA: Diagnosis not present

## 2022-02-18 DIAGNOSIS — G4719 Other hypersomnia: Secondary | ICD-10-CM | POA: Diagnosis not present

## 2022-03-17 DIAGNOSIS — N926 Irregular menstruation, unspecified: Secondary | ICD-10-CM | POA: Diagnosis not present

## 2022-03-17 DIAGNOSIS — Z6822 Body mass index (BMI) 22.0-22.9, adult: Secondary | ICD-10-CM | POA: Diagnosis not present

## 2022-03-17 DIAGNOSIS — Z01419 Encounter for gynecological examination (general) (routine) without abnormal findings: Secondary | ICD-10-CM | POA: Diagnosis not present

## 2022-03-17 DIAGNOSIS — N871 Moderate cervical dysplasia: Secondary | ICD-10-CM | POA: Diagnosis not present

## 2022-04-14 DIAGNOSIS — Z124 Encounter for screening for malignant neoplasm of cervix: Secondary | ICD-10-CM | POA: Diagnosis not present

## 2022-07-29 DIAGNOSIS — H18822 Corneal disorder due to contact lens, left eye: Secondary | ICD-10-CM | POA: Diagnosis not present

## 2023-01-30 DIAGNOSIS — N939 Abnormal uterine and vaginal bleeding, unspecified: Secondary | ICD-10-CM | POA: Diagnosis not present

## 2023-01-30 DIAGNOSIS — M6289 Other specified disorders of muscle: Secondary | ICD-10-CM | POA: Diagnosis not present

## 2023-01-30 DIAGNOSIS — Z1330 Encounter for screening examination for mental health and behavioral disorders, unspecified: Secondary | ICD-10-CM | POA: Diagnosis not present

## 2023-04-13 DIAGNOSIS — M62830 Muscle spasm of back: Secondary | ICD-10-CM | POA: Diagnosis not present

## 2023-04-18 DIAGNOSIS — M531 Cervicobrachial syndrome: Secondary | ICD-10-CM | POA: Diagnosis not present

## 2023-04-18 DIAGNOSIS — M9902 Segmental and somatic dysfunction of thoracic region: Secondary | ICD-10-CM | POA: Diagnosis not present

## 2023-04-18 DIAGNOSIS — M9901 Segmental and somatic dysfunction of cervical region: Secondary | ICD-10-CM | POA: Diagnosis not present

## 2023-04-18 DIAGNOSIS — M9908 Segmental and somatic dysfunction of rib cage: Secondary | ICD-10-CM | POA: Diagnosis not present

## 2023-04-20 DIAGNOSIS — M9901 Segmental and somatic dysfunction of cervical region: Secondary | ICD-10-CM | POA: Diagnosis not present

## 2023-04-20 DIAGNOSIS — M9902 Segmental and somatic dysfunction of thoracic region: Secondary | ICD-10-CM | POA: Diagnosis not present

## 2023-04-20 DIAGNOSIS — M9908 Segmental and somatic dysfunction of rib cage: Secondary | ICD-10-CM | POA: Diagnosis not present

## 2023-04-20 DIAGNOSIS — M531 Cervicobrachial syndrome: Secondary | ICD-10-CM | POA: Diagnosis not present

## 2023-04-24 DIAGNOSIS — M62830 Muscle spasm of back: Secondary | ICD-10-CM | POA: Diagnosis not present

## 2023-04-25 DIAGNOSIS — M9902 Segmental and somatic dysfunction of thoracic region: Secondary | ICD-10-CM | POA: Diagnosis not present

## 2023-04-25 DIAGNOSIS — M9908 Segmental and somatic dysfunction of rib cage: Secondary | ICD-10-CM | POA: Diagnosis not present

## 2023-04-25 DIAGNOSIS — M531 Cervicobrachial syndrome: Secondary | ICD-10-CM | POA: Diagnosis not present

## 2023-04-25 DIAGNOSIS — M9901 Segmental and somatic dysfunction of cervical region: Secondary | ICD-10-CM | POA: Diagnosis not present

## 2023-04-27 DIAGNOSIS — M531 Cervicobrachial syndrome: Secondary | ICD-10-CM | POA: Diagnosis not present

## 2023-04-27 DIAGNOSIS — M9902 Segmental and somatic dysfunction of thoracic region: Secondary | ICD-10-CM | POA: Diagnosis not present

## 2023-04-27 DIAGNOSIS — M9901 Segmental and somatic dysfunction of cervical region: Secondary | ICD-10-CM | POA: Diagnosis not present

## 2023-04-27 DIAGNOSIS — M9908 Segmental and somatic dysfunction of rib cage: Secondary | ICD-10-CM | POA: Diagnosis not present

## 2023-05-02 DIAGNOSIS — M9901 Segmental and somatic dysfunction of cervical region: Secondary | ICD-10-CM | POA: Diagnosis not present

## 2023-05-02 DIAGNOSIS — M9902 Segmental and somatic dysfunction of thoracic region: Secondary | ICD-10-CM | POA: Diagnosis not present

## 2023-05-02 DIAGNOSIS — M531 Cervicobrachial syndrome: Secondary | ICD-10-CM | POA: Diagnosis not present

## 2023-05-02 DIAGNOSIS — M9908 Segmental and somatic dysfunction of rib cage: Secondary | ICD-10-CM | POA: Diagnosis not present

## 2023-05-04 DIAGNOSIS — M531 Cervicobrachial syndrome: Secondary | ICD-10-CM | POA: Diagnosis not present

## 2023-05-04 DIAGNOSIS — M9902 Segmental and somatic dysfunction of thoracic region: Secondary | ICD-10-CM | POA: Diagnosis not present

## 2023-05-04 DIAGNOSIS — M9908 Segmental and somatic dysfunction of rib cage: Secondary | ICD-10-CM | POA: Diagnosis not present

## 2023-05-04 DIAGNOSIS — M9901 Segmental and somatic dysfunction of cervical region: Secondary | ICD-10-CM | POA: Diagnosis not present

## 2023-05-09 DIAGNOSIS — M9901 Segmental and somatic dysfunction of cervical region: Secondary | ICD-10-CM | POA: Diagnosis not present

## 2023-05-09 DIAGNOSIS — M9902 Segmental and somatic dysfunction of thoracic region: Secondary | ICD-10-CM | POA: Diagnosis not present

## 2023-05-09 DIAGNOSIS — M531 Cervicobrachial syndrome: Secondary | ICD-10-CM | POA: Diagnosis not present

## 2023-05-09 DIAGNOSIS — M9908 Segmental and somatic dysfunction of rib cage: Secondary | ICD-10-CM | POA: Diagnosis not present

## 2023-05-11 DIAGNOSIS — M5386 Other specified dorsopathies, lumbar region: Secondary | ICD-10-CM | POA: Diagnosis not present

## 2023-05-11 DIAGNOSIS — M9905 Segmental and somatic dysfunction of pelvic region: Secondary | ICD-10-CM | POA: Diagnosis not present

## 2023-05-11 DIAGNOSIS — M9902 Segmental and somatic dysfunction of thoracic region: Secondary | ICD-10-CM | POA: Diagnosis not present

## 2023-05-11 DIAGNOSIS — M9901 Segmental and somatic dysfunction of cervical region: Secondary | ICD-10-CM | POA: Diagnosis not present

## 2023-05-11 DIAGNOSIS — M9903 Segmental and somatic dysfunction of lumbar region: Secondary | ICD-10-CM | POA: Diagnosis not present

## 2023-05-11 DIAGNOSIS — M531 Cervicobrachial syndrome: Secondary | ICD-10-CM | POA: Diagnosis not present

## 2023-05-16 DIAGNOSIS — M5386 Other specified dorsopathies, lumbar region: Secondary | ICD-10-CM | POA: Diagnosis not present

## 2023-05-16 DIAGNOSIS — M9903 Segmental and somatic dysfunction of lumbar region: Secondary | ICD-10-CM | POA: Diagnosis not present

## 2023-05-16 DIAGNOSIS — M9901 Segmental and somatic dysfunction of cervical region: Secondary | ICD-10-CM | POA: Diagnosis not present

## 2023-05-16 DIAGNOSIS — M9902 Segmental and somatic dysfunction of thoracic region: Secondary | ICD-10-CM | POA: Diagnosis not present

## 2023-05-16 DIAGNOSIS — M531 Cervicobrachial syndrome: Secondary | ICD-10-CM | POA: Diagnosis not present

## 2023-05-23 DIAGNOSIS — M531 Cervicobrachial syndrome: Secondary | ICD-10-CM | POA: Diagnosis not present

## 2023-05-23 DIAGNOSIS — M9903 Segmental and somatic dysfunction of lumbar region: Secondary | ICD-10-CM | POA: Diagnosis not present

## 2023-05-23 DIAGNOSIS — M5386 Other specified dorsopathies, lumbar region: Secondary | ICD-10-CM | POA: Diagnosis not present

## 2023-05-23 DIAGNOSIS — M9902 Segmental and somatic dysfunction of thoracic region: Secondary | ICD-10-CM | POA: Diagnosis not present

## 2023-05-23 DIAGNOSIS — M9901 Segmental and somatic dysfunction of cervical region: Secondary | ICD-10-CM | POA: Diagnosis not present

## 2023-05-30 DIAGNOSIS — M9902 Segmental and somatic dysfunction of thoracic region: Secondary | ICD-10-CM | POA: Diagnosis not present

## 2023-05-30 DIAGNOSIS — M9903 Segmental and somatic dysfunction of lumbar region: Secondary | ICD-10-CM | POA: Diagnosis not present

## 2023-05-30 DIAGNOSIS — M531 Cervicobrachial syndrome: Secondary | ICD-10-CM | POA: Diagnosis not present

## 2023-05-30 DIAGNOSIS — M5386 Other specified dorsopathies, lumbar region: Secondary | ICD-10-CM | POA: Diagnosis not present

## 2023-05-30 DIAGNOSIS — M9901 Segmental and somatic dysfunction of cervical region: Secondary | ICD-10-CM | POA: Diagnosis not present

## 2023-06-13 DIAGNOSIS — M5386 Other specified dorsopathies, lumbar region: Secondary | ICD-10-CM | POA: Diagnosis not present

## 2023-06-13 DIAGNOSIS — M9902 Segmental and somatic dysfunction of thoracic region: Secondary | ICD-10-CM | POA: Diagnosis not present

## 2023-06-13 DIAGNOSIS — M9903 Segmental and somatic dysfunction of lumbar region: Secondary | ICD-10-CM | POA: Diagnosis not present

## 2023-06-13 DIAGNOSIS — M531 Cervicobrachial syndrome: Secondary | ICD-10-CM | POA: Diagnosis not present

## 2023-06-13 DIAGNOSIS — M9901 Segmental and somatic dysfunction of cervical region: Secondary | ICD-10-CM | POA: Diagnosis not present

## 2023-07-04 DIAGNOSIS — M9903 Segmental and somatic dysfunction of lumbar region: Secondary | ICD-10-CM | POA: Diagnosis not present

## 2023-07-04 DIAGNOSIS — M9902 Segmental and somatic dysfunction of thoracic region: Secondary | ICD-10-CM | POA: Diagnosis not present

## 2023-07-04 DIAGNOSIS — M531 Cervicobrachial syndrome: Secondary | ICD-10-CM | POA: Diagnosis not present

## 2023-07-04 DIAGNOSIS — M9901 Segmental and somatic dysfunction of cervical region: Secondary | ICD-10-CM | POA: Diagnosis not present

## 2023-07-04 DIAGNOSIS — M5386 Other specified dorsopathies, lumbar region: Secondary | ICD-10-CM | POA: Diagnosis not present

## 2023-07-19 DIAGNOSIS — M9903 Segmental and somatic dysfunction of lumbar region: Secondary | ICD-10-CM | POA: Diagnosis not present

## 2023-07-19 DIAGNOSIS — M9901 Segmental and somatic dysfunction of cervical region: Secondary | ICD-10-CM | POA: Diagnosis not present

## 2023-07-19 DIAGNOSIS — M9902 Segmental and somatic dysfunction of thoracic region: Secondary | ICD-10-CM | POA: Diagnosis not present

## 2023-07-19 DIAGNOSIS — M5386 Other specified dorsopathies, lumbar region: Secondary | ICD-10-CM | POA: Diagnosis not present

## 2023-07-19 DIAGNOSIS — M531 Cervicobrachial syndrome: Secondary | ICD-10-CM | POA: Diagnosis not present

## 2023-08-02 DIAGNOSIS — M5386 Other specified dorsopathies, lumbar region: Secondary | ICD-10-CM | POA: Diagnosis not present

## 2023-08-02 DIAGNOSIS — M531 Cervicobrachial syndrome: Secondary | ICD-10-CM | POA: Diagnosis not present

## 2023-08-02 DIAGNOSIS — M9902 Segmental and somatic dysfunction of thoracic region: Secondary | ICD-10-CM | POA: Diagnosis not present

## 2023-08-02 DIAGNOSIS — M9903 Segmental and somatic dysfunction of lumbar region: Secondary | ICD-10-CM | POA: Diagnosis not present

## 2023-08-02 DIAGNOSIS — M9901 Segmental and somatic dysfunction of cervical region: Secondary | ICD-10-CM | POA: Diagnosis not present

## 2023-08-30 DIAGNOSIS — M531 Cervicobrachial syndrome: Secondary | ICD-10-CM | POA: Diagnosis not present

## 2023-08-30 DIAGNOSIS — M9902 Segmental and somatic dysfunction of thoracic region: Secondary | ICD-10-CM | POA: Diagnosis not present

## 2023-08-30 DIAGNOSIS — M9901 Segmental and somatic dysfunction of cervical region: Secondary | ICD-10-CM | POA: Diagnosis not present

## 2023-08-30 DIAGNOSIS — M5386 Other specified dorsopathies, lumbar region: Secondary | ICD-10-CM | POA: Diagnosis not present

## 2023-08-30 DIAGNOSIS — M9903 Segmental and somatic dysfunction of lumbar region: Secondary | ICD-10-CM | POA: Diagnosis not present

## 2023-09-20 DIAGNOSIS — M531 Cervicobrachial syndrome: Secondary | ICD-10-CM | POA: Diagnosis not present

## 2023-09-20 DIAGNOSIS — M9902 Segmental and somatic dysfunction of thoracic region: Secondary | ICD-10-CM | POA: Diagnosis not present

## 2023-09-20 DIAGNOSIS — M9903 Segmental and somatic dysfunction of lumbar region: Secondary | ICD-10-CM | POA: Diagnosis not present

## 2023-09-20 DIAGNOSIS — M9901 Segmental and somatic dysfunction of cervical region: Secondary | ICD-10-CM | POA: Diagnosis not present

## 2023-09-20 DIAGNOSIS — M5386 Other specified dorsopathies, lumbar region: Secondary | ICD-10-CM | POA: Diagnosis not present

## 2023-10-03 DIAGNOSIS — M9903 Segmental and somatic dysfunction of lumbar region: Secondary | ICD-10-CM | POA: Diagnosis not present

## 2023-10-03 DIAGNOSIS — M9901 Segmental and somatic dysfunction of cervical region: Secondary | ICD-10-CM | POA: Diagnosis not present

## 2023-10-03 DIAGNOSIS — M531 Cervicobrachial syndrome: Secondary | ICD-10-CM | POA: Diagnosis not present

## 2023-10-03 DIAGNOSIS — M5386 Other specified dorsopathies, lumbar region: Secondary | ICD-10-CM | POA: Diagnosis not present

## 2023-10-03 DIAGNOSIS — M9902 Segmental and somatic dysfunction of thoracic region: Secondary | ICD-10-CM | POA: Diagnosis not present

## 2023-10-03 DIAGNOSIS — M9908 Segmental and somatic dysfunction of rib cage: Secondary | ICD-10-CM | POA: Diagnosis not present

## 2023-10-10 DIAGNOSIS — M9903 Segmental and somatic dysfunction of lumbar region: Secondary | ICD-10-CM | POA: Diagnosis not present

## 2023-10-10 DIAGNOSIS — M7712 Lateral epicondylitis, left elbow: Secondary | ICD-10-CM | POA: Diagnosis not present

## 2023-10-10 DIAGNOSIS — M9902 Segmental and somatic dysfunction of thoracic region: Secondary | ICD-10-CM | POA: Diagnosis not present

## 2023-10-10 DIAGNOSIS — M531 Cervicobrachial syndrome: Secondary | ICD-10-CM | POA: Diagnosis not present

## 2023-10-10 DIAGNOSIS — M9901 Segmental and somatic dysfunction of cervical region: Secondary | ICD-10-CM | POA: Diagnosis not present

## 2023-10-18 DIAGNOSIS — M9903 Segmental and somatic dysfunction of lumbar region: Secondary | ICD-10-CM | POA: Diagnosis not present

## 2023-10-18 DIAGNOSIS — M531 Cervicobrachial syndrome: Secondary | ICD-10-CM | POA: Diagnosis not present

## 2023-10-18 DIAGNOSIS — M7712 Lateral epicondylitis, left elbow: Secondary | ICD-10-CM | POA: Diagnosis not present

## 2023-10-18 DIAGNOSIS — M9901 Segmental and somatic dysfunction of cervical region: Secondary | ICD-10-CM | POA: Diagnosis not present

## 2023-10-18 DIAGNOSIS — M9902 Segmental and somatic dysfunction of thoracic region: Secondary | ICD-10-CM | POA: Diagnosis not present

## 2023-10-24 DIAGNOSIS — M7712 Lateral epicondylitis, left elbow: Secondary | ICD-10-CM | POA: Diagnosis not present

## 2023-10-24 DIAGNOSIS — M9901 Segmental and somatic dysfunction of cervical region: Secondary | ICD-10-CM | POA: Diagnosis not present

## 2023-10-24 DIAGNOSIS — M9903 Segmental and somatic dysfunction of lumbar region: Secondary | ICD-10-CM | POA: Diagnosis not present

## 2023-10-24 DIAGNOSIS — M531 Cervicobrachial syndrome: Secondary | ICD-10-CM | POA: Diagnosis not present

## 2023-10-24 DIAGNOSIS — M9902 Segmental and somatic dysfunction of thoracic region: Secondary | ICD-10-CM | POA: Diagnosis not present

## 2023-11-01 DIAGNOSIS — M7712 Lateral epicondylitis, left elbow: Secondary | ICD-10-CM | POA: Diagnosis not present

## 2023-11-01 DIAGNOSIS — M9902 Segmental and somatic dysfunction of thoracic region: Secondary | ICD-10-CM | POA: Diagnosis not present

## 2023-11-01 DIAGNOSIS — M531 Cervicobrachial syndrome: Secondary | ICD-10-CM | POA: Diagnosis not present

## 2023-11-01 DIAGNOSIS — M9901 Segmental and somatic dysfunction of cervical region: Secondary | ICD-10-CM | POA: Diagnosis not present

## 2023-11-01 DIAGNOSIS — M9903 Segmental and somatic dysfunction of lumbar region: Secondary | ICD-10-CM | POA: Diagnosis not present

## 2023-11-07 DIAGNOSIS — M9901 Segmental and somatic dysfunction of cervical region: Secondary | ICD-10-CM | POA: Diagnosis not present

## 2023-11-07 DIAGNOSIS — M9903 Segmental and somatic dysfunction of lumbar region: Secondary | ICD-10-CM | POA: Diagnosis not present

## 2023-11-07 DIAGNOSIS — M531 Cervicobrachial syndrome: Secondary | ICD-10-CM | POA: Diagnosis not present

## 2023-11-07 DIAGNOSIS — M9902 Segmental and somatic dysfunction of thoracic region: Secondary | ICD-10-CM | POA: Diagnosis not present

## 2023-11-07 DIAGNOSIS — M7712 Lateral epicondylitis, left elbow: Secondary | ICD-10-CM | POA: Diagnosis not present

## 2023-11-14 DIAGNOSIS — M9902 Segmental and somatic dysfunction of thoracic region: Secondary | ICD-10-CM | POA: Diagnosis not present

## 2023-11-14 DIAGNOSIS — M531 Cervicobrachial syndrome: Secondary | ICD-10-CM | POA: Diagnosis not present

## 2023-11-14 DIAGNOSIS — M9901 Segmental and somatic dysfunction of cervical region: Secondary | ICD-10-CM | POA: Diagnosis not present

## 2023-11-14 DIAGNOSIS — M7712 Lateral epicondylitis, left elbow: Secondary | ICD-10-CM | POA: Diagnosis not present

## 2023-11-14 DIAGNOSIS — M9903 Segmental and somatic dysfunction of lumbar region: Secondary | ICD-10-CM | POA: Diagnosis not present

## 2023-11-23 DIAGNOSIS — M9902 Segmental and somatic dysfunction of thoracic region: Secondary | ICD-10-CM | POA: Diagnosis not present

## 2023-11-23 DIAGNOSIS — M9905 Segmental and somatic dysfunction of pelvic region: Secondary | ICD-10-CM | POA: Diagnosis not present

## 2023-11-23 DIAGNOSIS — M531 Cervicobrachial syndrome: Secondary | ICD-10-CM | POA: Diagnosis not present

## 2023-11-23 DIAGNOSIS — M9903 Segmental and somatic dysfunction of lumbar region: Secondary | ICD-10-CM | POA: Diagnosis not present

## 2023-11-23 DIAGNOSIS — M9901 Segmental and somatic dysfunction of cervical region: Secondary | ICD-10-CM | POA: Diagnosis not present

## 2023-11-23 DIAGNOSIS — M9907 Segmental and somatic dysfunction of upper extremity: Secondary | ICD-10-CM | POA: Diagnosis not present

## 2023-11-28 DIAGNOSIS — Z6823 Body mass index (BMI) 23.0-23.9, adult: Secondary | ICD-10-CM | POA: Diagnosis not present

## 2023-11-28 DIAGNOSIS — R5383 Other fatigue: Secondary | ICD-10-CM | POA: Diagnosis not present

## 2023-11-28 DIAGNOSIS — Z124 Encounter for screening for malignant neoplasm of cervix: Secondary | ICD-10-CM | POA: Diagnosis not present

## 2023-11-28 DIAGNOSIS — Z01419 Encounter for gynecological examination (general) (routine) without abnormal findings: Secondary | ICD-10-CM | POA: Diagnosis not present

## 2023-11-30 DIAGNOSIS — M9903 Segmental and somatic dysfunction of lumbar region: Secondary | ICD-10-CM | POA: Diagnosis not present

## 2023-11-30 DIAGNOSIS — M531 Cervicobrachial syndrome: Secondary | ICD-10-CM | POA: Diagnosis not present

## 2023-11-30 DIAGNOSIS — M9907 Segmental and somatic dysfunction of upper extremity: Secondary | ICD-10-CM | POA: Diagnosis not present

## 2023-11-30 DIAGNOSIS — M9902 Segmental and somatic dysfunction of thoracic region: Secondary | ICD-10-CM | POA: Diagnosis not present

## 2023-11-30 DIAGNOSIS — M9901 Segmental and somatic dysfunction of cervical region: Secondary | ICD-10-CM | POA: Diagnosis not present

## 2023-12-04 DIAGNOSIS — M9907 Segmental and somatic dysfunction of upper extremity: Secondary | ICD-10-CM | POA: Diagnosis not present

## 2023-12-04 DIAGNOSIS — M9902 Segmental and somatic dysfunction of thoracic region: Secondary | ICD-10-CM | POA: Diagnosis not present

## 2023-12-04 DIAGNOSIS — M9901 Segmental and somatic dysfunction of cervical region: Secondary | ICD-10-CM | POA: Diagnosis not present

## 2023-12-04 DIAGNOSIS — M531 Cervicobrachial syndrome: Secondary | ICD-10-CM | POA: Diagnosis not present

## 2023-12-04 DIAGNOSIS — M9903 Segmental and somatic dysfunction of lumbar region: Secondary | ICD-10-CM | POA: Diagnosis not present

## 2023-12-12 DIAGNOSIS — M9902 Segmental and somatic dysfunction of thoracic region: Secondary | ICD-10-CM | POA: Diagnosis not present

## 2023-12-12 DIAGNOSIS — M9907 Segmental and somatic dysfunction of upper extremity: Secondary | ICD-10-CM | POA: Diagnosis not present

## 2023-12-12 DIAGNOSIS — M9901 Segmental and somatic dysfunction of cervical region: Secondary | ICD-10-CM | POA: Diagnosis not present

## 2023-12-12 DIAGNOSIS — M531 Cervicobrachial syndrome: Secondary | ICD-10-CM | POA: Diagnosis not present

## 2023-12-12 DIAGNOSIS — M9903 Segmental and somatic dysfunction of lumbar region: Secondary | ICD-10-CM | POA: Diagnosis not present

## 2023-12-14 DIAGNOSIS — R350 Frequency of micturition: Secondary | ICD-10-CM | POA: Diagnosis not present

## 2023-12-19 DIAGNOSIS — M9907 Segmental and somatic dysfunction of upper extremity: Secondary | ICD-10-CM | POA: Diagnosis not present

## 2023-12-19 DIAGNOSIS — M531 Cervicobrachial syndrome: Secondary | ICD-10-CM | POA: Diagnosis not present

## 2023-12-19 DIAGNOSIS — M9902 Segmental and somatic dysfunction of thoracic region: Secondary | ICD-10-CM | POA: Diagnosis not present

## 2023-12-19 DIAGNOSIS — M9901 Segmental and somatic dysfunction of cervical region: Secondary | ICD-10-CM | POA: Diagnosis not present

## 2023-12-19 DIAGNOSIS — M9903 Segmental and somatic dysfunction of lumbar region: Secondary | ICD-10-CM | POA: Diagnosis not present

## 2023-12-26 DIAGNOSIS — M9903 Segmental and somatic dysfunction of lumbar region: Secondary | ICD-10-CM | POA: Diagnosis not present

## 2023-12-26 DIAGNOSIS — M9907 Segmental and somatic dysfunction of upper extremity: Secondary | ICD-10-CM | POA: Diagnosis not present

## 2023-12-26 DIAGNOSIS — M9901 Segmental and somatic dysfunction of cervical region: Secondary | ICD-10-CM | POA: Diagnosis not present

## 2023-12-26 DIAGNOSIS — M531 Cervicobrachial syndrome: Secondary | ICD-10-CM | POA: Diagnosis not present

## 2023-12-26 DIAGNOSIS — M9902 Segmental and somatic dysfunction of thoracic region: Secondary | ICD-10-CM | POA: Diagnosis not present

## 2024-01-02 DIAGNOSIS — M9903 Segmental and somatic dysfunction of lumbar region: Secondary | ICD-10-CM | POA: Diagnosis not present

## 2024-01-02 DIAGNOSIS — M9907 Segmental and somatic dysfunction of upper extremity: Secondary | ICD-10-CM | POA: Diagnosis not present

## 2024-01-02 DIAGNOSIS — M531 Cervicobrachial syndrome: Secondary | ICD-10-CM | POA: Diagnosis not present

## 2024-01-02 DIAGNOSIS — M9901 Segmental and somatic dysfunction of cervical region: Secondary | ICD-10-CM | POA: Diagnosis not present

## 2024-01-02 DIAGNOSIS — M9902 Segmental and somatic dysfunction of thoracic region: Secondary | ICD-10-CM | POA: Diagnosis not present

## 2024-01-09 DIAGNOSIS — M9907 Segmental and somatic dysfunction of upper extremity: Secondary | ICD-10-CM | POA: Diagnosis not present

## 2024-01-09 DIAGNOSIS — M9903 Segmental and somatic dysfunction of lumbar region: Secondary | ICD-10-CM | POA: Diagnosis not present

## 2024-01-09 DIAGNOSIS — M531 Cervicobrachial syndrome: Secondary | ICD-10-CM | POA: Diagnosis not present

## 2024-01-09 DIAGNOSIS — M9901 Segmental and somatic dysfunction of cervical region: Secondary | ICD-10-CM | POA: Diagnosis not present

## 2024-01-09 DIAGNOSIS — M9902 Segmental and somatic dysfunction of thoracic region: Secondary | ICD-10-CM | POA: Diagnosis not present

## 2024-01-10 DIAGNOSIS — R5382 Chronic fatigue, unspecified: Secondary | ICD-10-CM | POA: Diagnosis not present

## 2024-01-10 DIAGNOSIS — Z1329 Encounter for screening for other suspected endocrine disorder: Secondary | ICD-10-CM | POA: Diagnosis not present

## 2024-01-10 DIAGNOSIS — Z13220 Encounter for screening for lipoid disorders: Secondary | ICD-10-CM | POA: Diagnosis not present

## 2024-01-10 DIAGNOSIS — Z0000 Encounter for general adult medical examination without abnormal findings: Secondary | ICD-10-CM | POA: Diagnosis not present

## 2024-01-10 DIAGNOSIS — R718 Other abnormality of red blood cells: Secondary | ICD-10-CM | POA: Diagnosis not present

## 2024-01-15 DIAGNOSIS — R5383 Other fatigue: Secondary | ICD-10-CM | POA: Diagnosis not present

## 2024-01-23 DIAGNOSIS — M9903 Segmental and somatic dysfunction of lumbar region: Secondary | ICD-10-CM | POA: Diagnosis not present

## 2024-01-23 DIAGNOSIS — M9902 Segmental and somatic dysfunction of thoracic region: Secondary | ICD-10-CM | POA: Diagnosis not present

## 2024-01-23 DIAGNOSIS — M531 Cervicobrachial syndrome: Secondary | ICD-10-CM | POA: Diagnosis not present

## 2024-01-23 DIAGNOSIS — M9901 Segmental and somatic dysfunction of cervical region: Secondary | ICD-10-CM | POA: Diagnosis not present

## 2024-01-26 DIAGNOSIS — R000 Tachycardia, unspecified: Secondary | ICD-10-CM | POA: Diagnosis not present

## 2024-01-26 DIAGNOSIS — R5383 Other fatigue: Secondary | ICD-10-CM | POA: Diagnosis not present

## 2024-02-07 DIAGNOSIS — R000 Tachycardia, unspecified: Secondary | ICD-10-CM | POA: Diagnosis not present

## 2024-02-07 DIAGNOSIS — Z1330 Encounter for screening examination for mental health and behavioral disorders, unspecified: Secondary | ICD-10-CM | POA: Diagnosis not present

## 2024-02-07 DIAGNOSIS — R002 Palpitations: Secondary | ICD-10-CM | POA: Diagnosis not present

## 2024-02-07 DIAGNOSIS — E785 Hyperlipidemia, unspecified: Secondary | ICD-10-CM | POA: Diagnosis not present

## 2024-02-20 DIAGNOSIS — M9901 Segmental and somatic dysfunction of cervical region: Secondary | ICD-10-CM | POA: Diagnosis not present

## 2024-02-20 DIAGNOSIS — M9903 Segmental and somatic dysfunction of lumbar region: Secondary | ICD-10-CM | POA: Diagnosis not present

## 2024-02-20 DIAGNOSIS — M531 Cervicobrachial syndrome: Secondary | ICD-10-CM | POA: Diagnosis not present

## 2024-02-20 DIAGNOSIS — M9902 Segmental and somatic dysfunction of thoracic region: Secondary | ICD-10-CM | POA: Diagnosis not present

## 2024-02-22 DIAGNOSIS — R000 Tachycardia, unspecified: Secondary | ICD-10-CM | POA: Diagnosis not present

## 2024-02-22 DIAGNOSIS — R002 Palpitations: Secondary | ICD-10-CM | POA: Diagnosis not present

## 2024-03-07 DIAGNOSIS — R070 Pain in throat: Secondary | ICD-10-CM | POA: Diagnosis not present

## 2024-03-07 DIAGNOSIS — U071 COVID-19: Secondary | ICD-10-CM | POA: Diagnosis not present

## 2024-03-13 DIAGNOSIS — F4323 Adjustment disorder with mixed anxiety and depressed mood: Secondary | ICD-10-CM | POA: Diagnosis not present

## 2024-03-14 DIAGNOSIS — R000 Tachycardia, unspecified: Secondary | ICD-10-CM | POA: Diagnosis not present

## 2024-03-14 DIAGNOSIS — R002 Palpitations: Secondary | ICD-10-CM | POA: Diagnosis not present

## 2024-03-19 DIAGNOSIS — F4323 Adjustment disorder with mixed anxiety and depressed mood: Secondary | ICD-10-CM | POA: Diagnosis not present

## 2024-03-26 DIAGNOSIS — M9903 Segmental and somatic dysfunction of lumbar region: Secondary | ICD-10-CM | POA: Diagnosis not present

## 2024-03-26 DIAGNOSIS — F4323 Adjustment disorder with mixed anxiety and depressed mood: Secondary | ICD-10-CM | POA: Diagnosis not present

## 2024-03-26 DIAGNOSIS — M9902 Segmental and somatic dysfunction of thoracic region: Secondary | ICD-10-CM | POA: Diagnosis not present

## 2024-03-26 DIAGNOSIS — M531 Cervicobrachial syndrome: Secondary | ICD-10-CM | POA: Diagnosis not present

## 2024-03-26 DIAGNOSIS — M9901 Segmental and somatic dysfunction of cervical region: Secondary | ICD-10-CM | POA: Diagnosis not present

## 2024-04-02 DIAGNOSIS — F4323 Adjustment disorder with mixed anxiety and depressed mood: Secondary | ICD-10-CM | POA: Diagnosis not present

## 2024-04-10 DIAGNOSIS — F4323 Adjustment disorder with mixed anxiety and depressed mood: Secondary | ICD-10-CM | POA: Diagnosis not present

## 2024-04-12 DIAGNOSIS — L03032 Cellulitis of left toe: Secondary | ICD-10-CM | POA: Diagnosis not present

## 2024-04-22 DIAGNOSIS — F4323 Adjustment disorder with mixed anxiety and depressed mood: Secondary | ICD-10-CM | POA: Diagnosis not present

## 2024-05-08 DIAGNOSIS — F4323 Adjustment disorder with mixed anxiety and depressed mood: Secondary | ICD-10-CM | POA: Diagnosis not present

## 2024-05-08 DIAGNOSIS — M9902 Segmental and somatic dysfunction of thoracic region: Secondary | ICD-10-CM | POA: Diagnosis not present

## 2024-05-08 DIAGNOSIS — M9901 Segmental and somatic dysfunction of cervical region: Secondary | ICD-10-CM | POA: Diagnosis not present

## 2024-05-08 DIAGNOSIS — M9905 Segmental and somatic dysfunction of pelvic region: Secondary | ICD-10-CM | POA: Diagnosis not present

## 2024-05-08 DIAGNOSIS — M5414 Radiculopathy, thoracic region: Secondary | ICD-10-CM | POA: Diagnosis not present

## 2024-05-08 DIAGNOSIS — M9903 Segmental and somatic dysfunction of lumbar region: Secondary | ICD-10-CM | POA: Diagnosis not present

## 2024-05-14 DIAGNOSIS — M5414 Radiculopathy, thoracic region: Secondary | ICD-10-CM | POA: Diagnosis not present

## 2024-05-14 DIAGNOSIS — M9903 Segmental and somatic dysfunction of lumbar region: Secondary | ICD-10-CM | POA: Diagnosis not present

## 2024-05-14 DIAGNOSIS — M9902 Segmental and somatic dysfunction of thoracic region: Secondary | ICD-10-CM | POA: Diagnosis not present

## 2024-05-14 DIAGNOSIS — M9901 Segmental and somatic dysfunction of cervical region: Secondary | ICD-10-CM | POA: Diagnosis not present

## 2024-06-04 DIAGNOSIS — M9902 Segmental and somatic dysfunction of thoracic region: Secondary | ICD-10-CM | POA: Diagnosis not present

## 2024-06-04 DIAGNOSIS — M9901 Segmental and somatic dysfunction of cervical region: Secondary | ICD-10-CM | POA: Diagnosis not present

## 2024-06-04 DIAGNOSIS — M9903 Segmental and somatic dysfunction of lumbar region: Secondary | ICD-10-CM | POA: Diagnosis not present

## 2024-06-04 DIAGNOSIS — M5414 Radiculopathy, thoracic region: Secondary | ICD-10-CM | POA: Diagnosis not present

## 2024-06-19 DIAGNOSIS — J029 Acute pharyngitis, unspecified: Secondary | ICD-10-CM | POA: Diagnosis not present

## 2024-06-19 DIAGNOSIS — R051 Acute cough: Secondary | ICD-10-CM | POA: Diagnosis not present

## 2024-07-23 DIAGNOSIS — M9902 Segmental and somatic dysfunction of thoracic region: Secondary | ICD-10-CM | POA: Diagnosis not present

## 2024-07-23 DIAGNOSIS — M9901 Segmental and somatic dysfunction of cervical region: Secondary | ICD-10-CM | POA: Diagnosis not present

## 2024-07-23 DIAGNOSIS — M9903 Segmental and somatic dysfunction of lumbar region: Secondary | ICD-10-CM | POA: Diagnosis not present

## 2024-07-23 DIAGNOSIS — M5414 Radiculopathy, thoracic region: Secondary | ICD-10-CM | POA: Diagnosis not present

## 2024-09-17 DIAGNOSIS — R110 Nausea: Secondary | ICD-10-CM | POA: Diagnosis not present

## 2024-09-17 DIAGNOSIS — R21 Rash and other nonspecific skin eruption: Secondary | ICD-10-CM | POA: Diagnosis not present

## 2024-09-17 DIAGNOSIS — R5383 Other fatigue: Secondary | ICD-10-CM | POA: Diagnosis not present

## 2024-10-23 DIAGNOSIS — M9903 Segmental and somatic dysfunction of lumbar region: Secondary | ICD-10-CM | POA: Diagnosis not present

## 2024-10-23 DIAGNOSIS — M9902 Segmental and somatic dysfunction of thoracic region: Secondary | ICD-10-CM | POA: Diagnosis not present

## 2024-10-23 DIAGNOSIS — M531 Cervicobrachial syndrome: Secondary | ICD-10-CM | POA: Diagnosis not present

## 2024-10-23 DIAGNOSIS — M9901 Segmental and somatic dysfunction of cervical region: Secondary | ICD-10-CM | POA: Diagnosis not present

## 2024-10-23 DIAGNOSIS — M9906 Segmental and somatic dysfunction of lower extremity: Secondary | ICD-10-CM | POA: Diagnosis not present

## 2024-10-30 DIAGNOSIS — M9902 Segmental and somatic dysfunction of thoracic region: Secondary | ICD-10-CM | POA: Diagnosis not present

## 2024-10-30 DIAGNOSIS — M531 Cervicobrachial syndrome: Secondary | ICD-10-CM | POA: Diagnosis not present

## 2024-10-30 DIAGNOSIS — M9903 Segmental and somatic dysfunction of lumbar region: Secondary | ICD-10-CM | POA: Diagnosis not present

## 2024-10-30 DIAGNOSIS — M9901 Segmental and somatic dysfunction of cervical region: Secondary | ICD-10-CM | POA: Diagnosis not present

## 2024-12-03 DIAGNOSIS — Z6825 Body mass index (BMI) 25.0-25.9, adult: Secondary | ICD-10-CM | POA: Diagnosis not present

## 2024-12-03 DIAGNOSIS — Z01419 Encounter for gynecological examination (general) (routine) without abnormal findings: Secondary | ICD-10-CM | POA: Diagnosis not present

## 2024-12-03 DIAGNOSIS — Z124 Encounter for screening for malignant neoplasm of cervix: Secondary | ICD-10-CM | POA: Diagnosis not present

## 2024-12-03 DIAGNOSIS — Z9889 Other specified postprocedural states: Secondary | ICD-10-CM | POA: Diagnosis not present
